# Patient Record
Sex: Female | Born: 1975 | Race: Black or African American | Hispanic: No | Marital: Single | Smoking: Never smoker
Health system: Southern US, Community
[De-identification: ages and names within clinical notes are randomized; demographics above are authoritative.]

## PROBLEM LIST (undated history)

## (undated) HISTORY — PX: FOOT SURGERY: SHX648

---

## 2002-09-14 ENCOUNTER — Emergency Department (HOSPITAL_COMMUNITY): Admission: EM | Admit: 2002-09-14 | Discharge: 2002-09-15 | Payer: Self-pay | Admitting: Emergency Medicine

## 2003-01-18 ENCOUNTER — Emergency Department (HOSPITAL_COMMUNITY): Admission: EM | Admit: 2003-01-18 | Discharge: 2003-01-18 | Payer: Self-pay | Admitting: Emergency Medicine

## 2006-08-16 ENCOUNTER — Emergency Department (HOSPITAL_COMMUNITY): Admission: EM | Admit: 2006-08-16 | Discharge: 2006-08-16 | Payer: Self-pay | Admitting: Family Medicine

## 2006-09-29 ENCOUNTER — Encounter: Admission: RE | Admit: 2006-09-29 | Discharge: 2006-12-28 | Payer: Self-pay | Admitting: Orthopedic Surgery

## 2007-05-10 ENCOUNTER — Emergency Department (HOSPITAL_COMMUNITY): Admission: EM | Admit: 2007-05-10 | Discharge: 2007-05-10 | Payer: Self-pay | Admitting: Emergency Medicine

## 2008-04-18 ENCOUNTER — Emergency Department (HOSPITAL_COMMUNITY): Admission: EM | Admit: 2008-04-18 | Discharge: 2008-04-18 | Payer: Self-pay | Admitting: Emergency Medicine

## 2008-09-29 IMAGING — CR DG ELBOW COMPLETE 3+V*R*
4 series · 4 of 4 positions shown · non-contrast
Comparison: none

CLINICAL DATA: Fell today with pain. 
 RIGHT ELBOW ? 4 VIEW:

[view not recorded (1 of 4)]
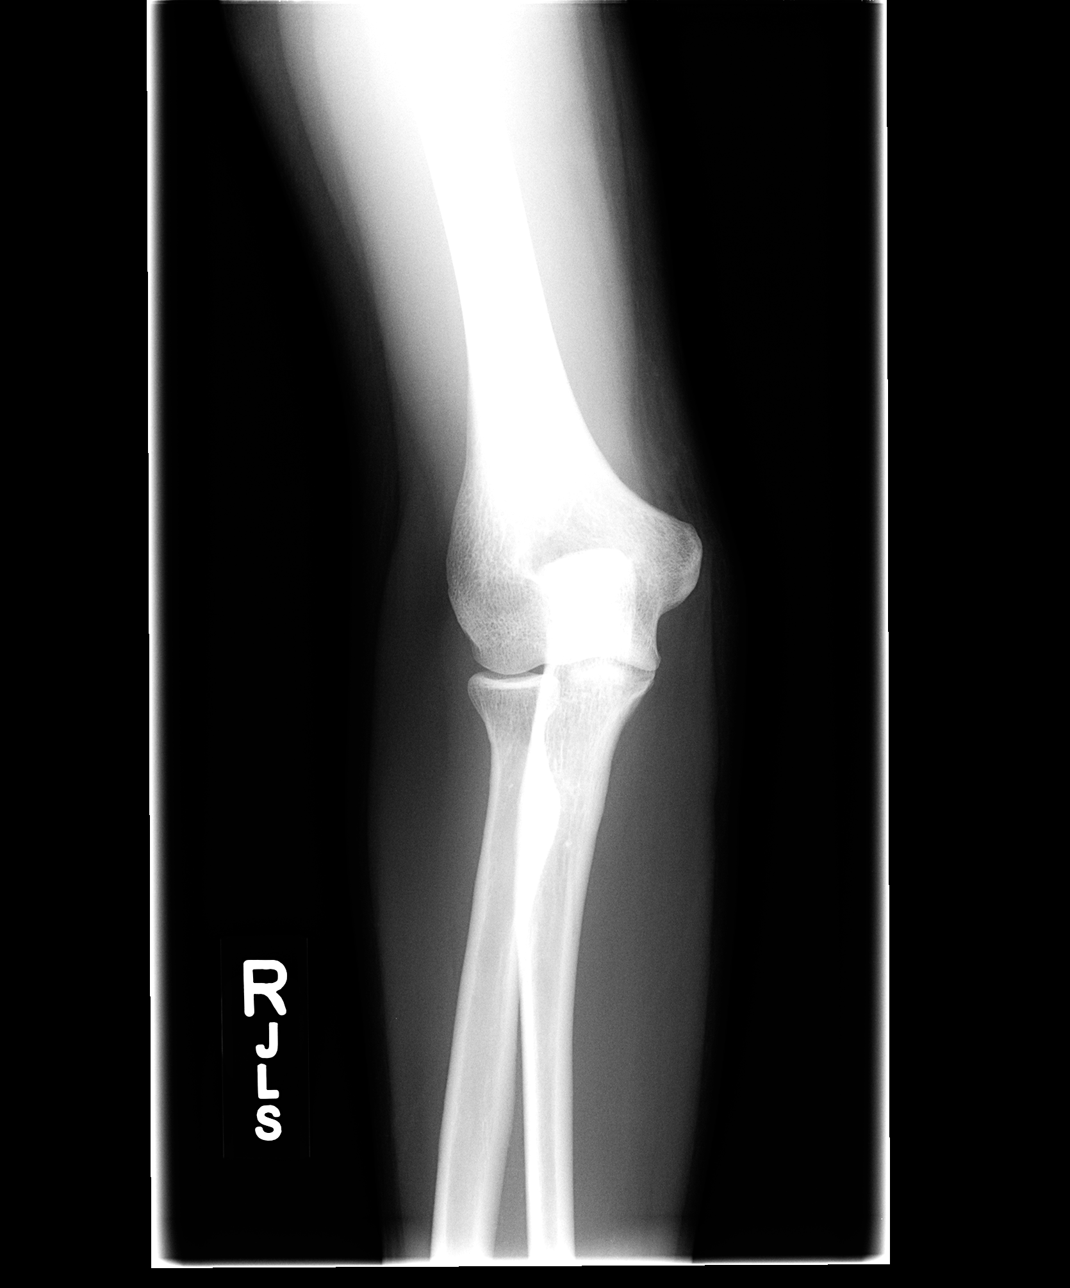

[view not recorded (2 of 4)]
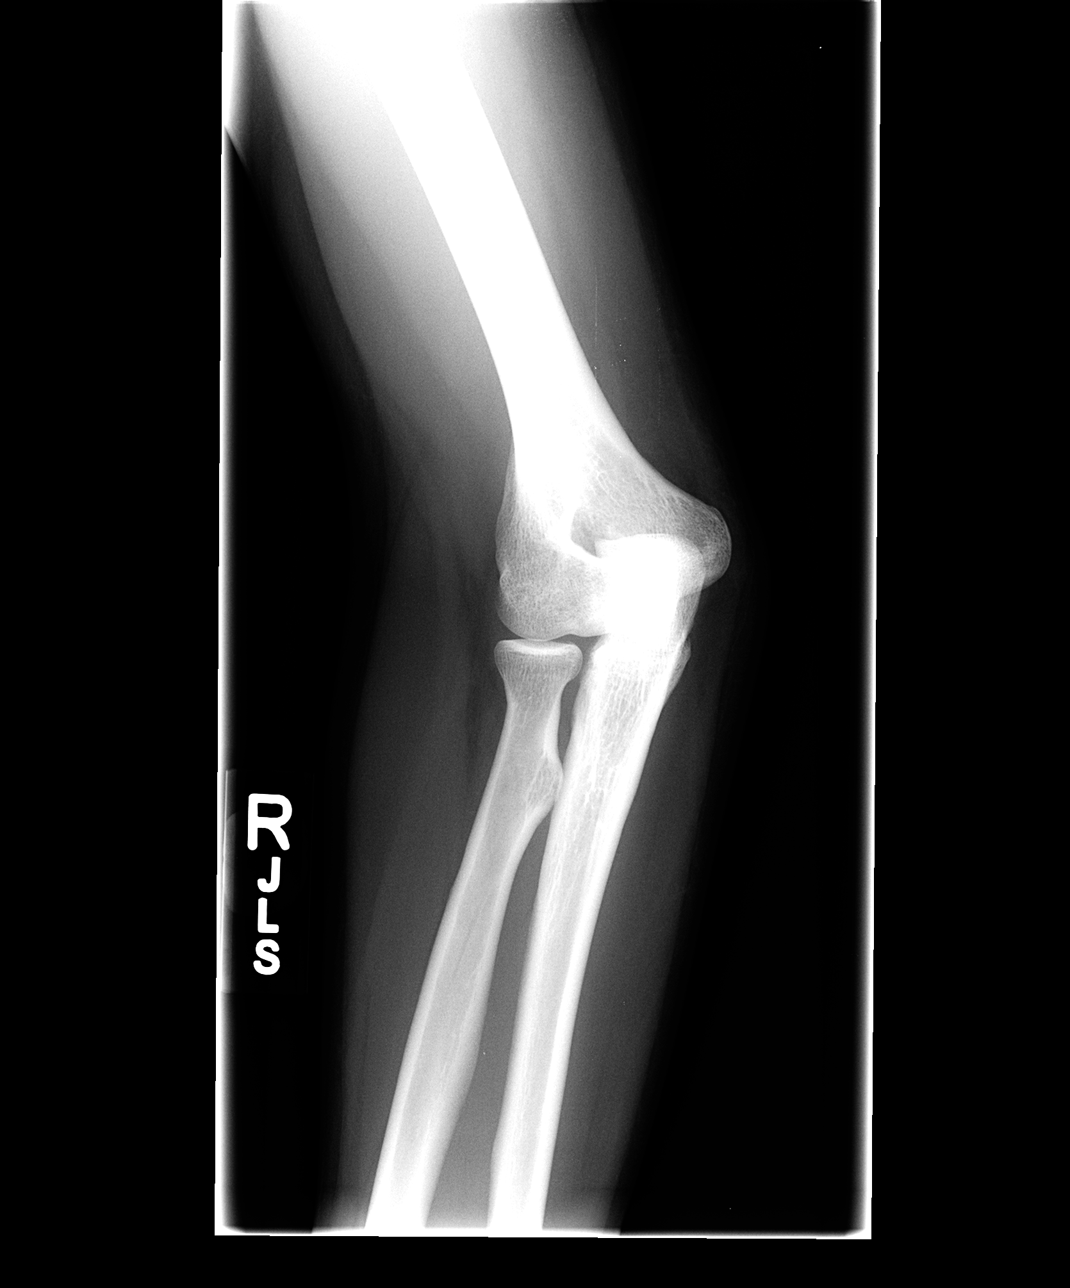

[view not recorded (3 of 4)]
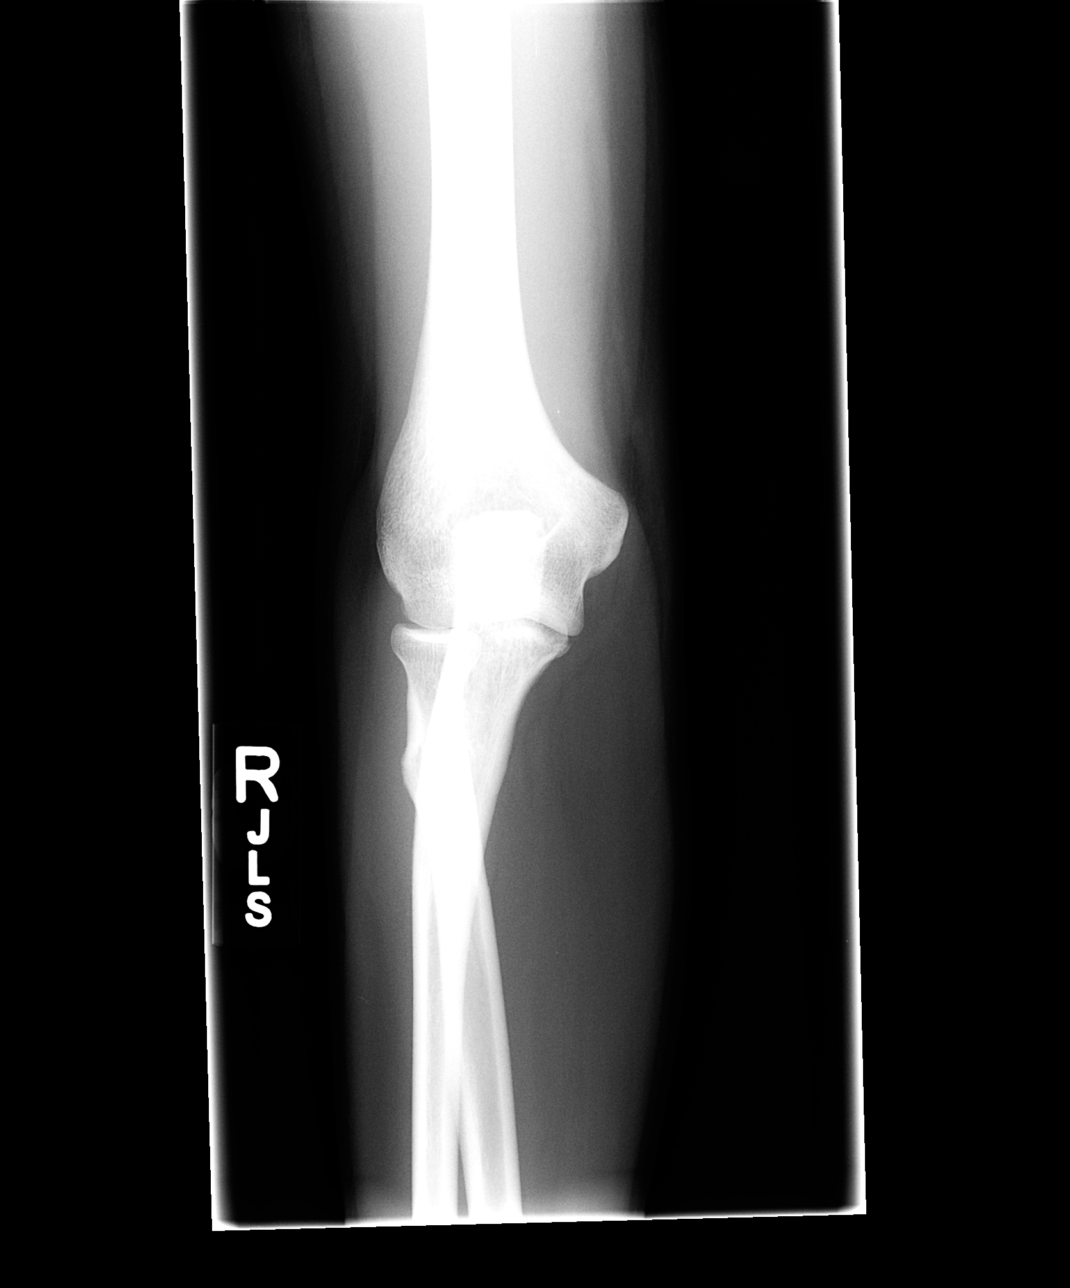

[view not recorded (4 of 4)]
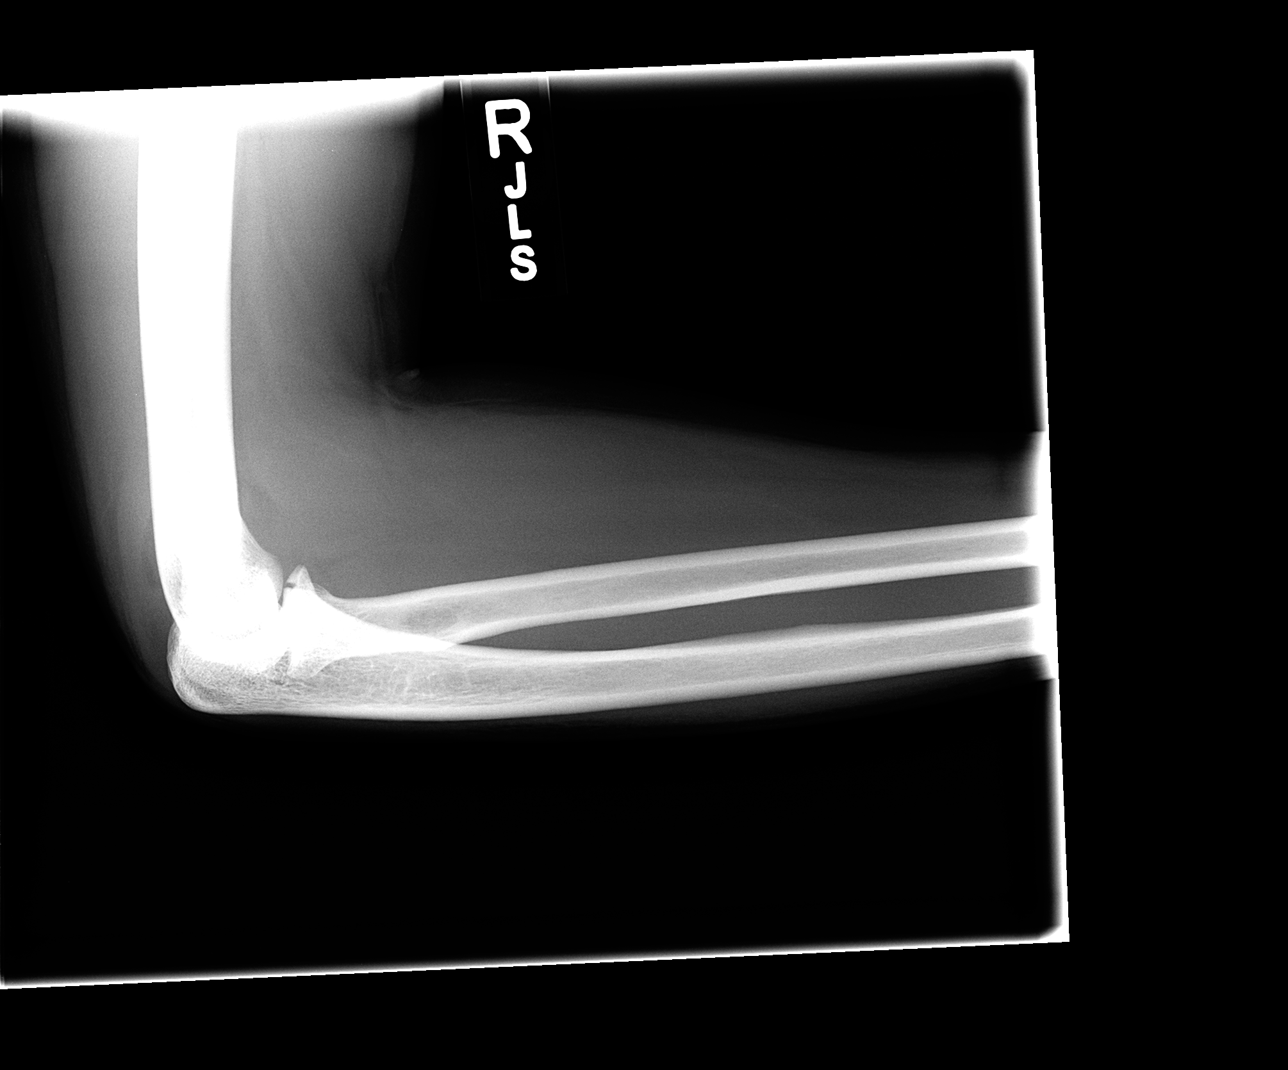

[4 of 4 positions shown; findings below may reference images not displayed]

FINDINGS: On the lateral view, there is a right elbow joint space effusion with displacement of anterior and posterior fat pads.  Also on the lateral view, are fractures noted through the coronoid process of the proximal right ulna.   The radial head appears intact.
IMPRESSION: Nondisplaced fracture of the coronoid process of the proximal right ulna with joint effusion.

## 2010-04-08 ENCOUNTER — Encounter: Admission: RE | Admit: 2010-04-08 | Discharge: 2010-04-08 | Payer: Self-pay | Admitting: Family Medicine

## 2010-04-09 ENCOUNTER — Emergency Department (HOSPITAL_COMMUNITY): Admission: EM | Admit: 2010-04-09 | Discharge: 2010-04-10 | Payer: Self-pay | Admitting: Emergency Medicine

## 2010-11-13 LAB — RAPID URINE DRUG SCREEN, HOSP PERFORMED
Amphetamines: NOT DETECTED
Benzodiazepines: NOT DETECTED
Cocaine: NOT DETECTED
Opiates: NOT DETECTED
Tetrahydrocannabinol: NOT DETECTED

## 2010-11-13 LAB — POCT I-STAT, CHEM 8
BUN: 16 mg/dL (ref 6–23)
Chloride: 107 mEq/L (ref 96–112)
Creatinine, Ser: 0.8 mg/dL (ref 0.4–1.2)
Potassium: 3.7 mEq/L (ref 3.5–5.1)
Sodium: 141 mEq/L (ref 135–145)

## 2010-11-13 LAB — URINALYSIS, ROUTINE W REFLEX MICROSCOPIC
Glucose, UA: NEGATIVE mg/dL
Ketones, ur: NEGATIVE mg/dL
Protein, ur: NEGATIVE mg/dL
Specific Gravity, Urine: 1.027 (ref 1.005–1.030)
pH: 6 (ref 5.0–8.0)

## 2010-11-13 LAB — DIFFERENTIAL
Basophils Absolute: 0.1 10*3/uL (ref 0.0–0.1)
Lymphocytes Relative: 39 % (ref 12–46)
Monocytes Relative: 7 % (ref 3–12)
Neutro Abs: 2.7 10*3/uL (ref 1.7–7.7)
Neutrophils Relative %: 48 % (ref 43–77)

## 2010-11-13 LAB — CBC
Hemoglobin: 11.4 g/dL — ABNORMAL LOW (ref 12.0–15.0)
MCH: 26.3 pg (ref 26.0–34.0)
MCHC: 32.6 g/dL (ref 30.0–36.0)
Platelets: 175 10*3/uL (ref 150–400)
RBC: 4.32 MIL/uL (ref 3.87–5.11)

## 2010-11-13 LAB — URINE MICROSCOPIC-ADD ON

## 2010-11-13 LAB — POCT PREGNANCY, URINE: Preg Test, Ur: NEGATIVE

## 2011-06-11 LAB — COMPREHENSIVE METABOLIC PANEL
Albumin: 3.7
BUN: 10
CO2: 26
Calcium: 8.8
Creatinine, Ser: 0.81
GFR calc Af Amer: >60

## 2011-06-11 LAB — DIFFERENTIAL
Basophils Relative: 0
Eosinophils Relative: 2
Monocytes Absolute: 0.3
Monocytes Relative: 5
Neutro Abs: 5.2
Neutrophils Relative %: 76

## 2011-06-11 LAB — URINALYSIS, ROUTINE W REFLEX MICROSCOPIC
Glucose, UA: NEGATIVE
Ketones, ur: NEGATIVE
pH: 6.5

## 2011-06-11 LAB — CBC
HCT: 37.3
Hemoglobin: 12.3
MCV: 79
WBC: 6.7

## 2011-06-11 LAB — URINE MICROSCOPIC-ADD ON

## 2011-06-11 LAB — PREGNANCY, URINE: Preg Test, Ur: NEGATIVE

## 2011-11-27 ENCOUNTER — Ambulatory Visit: Payer: Self-pay | Admitting: Internal Medicine

## 2011-11-27 VITALS — BP 117/78 | HR 61 | Temp 98.1°F | Resp 16 | Ht 68.0 in | Wt 167.0 lb

## 2011-11-27 DIAGNOSIS — D509 Iron deficiency anemia, unspecified: Secondary | ICD-10-CM

## 2011-11-27 DIAGNOSIS — D531 Other megaloblastic anemias, not elsewhere classified: Secondary | ICD-10-CM

## 2011-11-27 DIAGNOSIS — K921 Melena: Secondary | ICD-10-CM

## 2011-11-27 DIAGNOSIS — K589 Irritable bowel syndrome without diarrhea: Secondary | ICD-10-CM

## 2011-11-27 LAB — POCT CBC
Lymph, poc: 2.7 (ref 0.6–3.4)
MCV: 79.5 fL — AB (ref 80–97)
MPV: 11.2 fL (ref 0–99.8)
POC LYMPH PERCENT: 38.6 %L (ref 10–50)
POC MID %: 6.1 %M (ref 0–12)
Platelet Count, POC: 230 10*3/uL (ref 142–424)

## 2011-11-27 LAB — POCT SEDIMENTATION RATE: POCT SED RATE: 7 mm/hr (ref 0–22)

## 2011-11-27 MED ORDER — POLYETHYLENE GLYCOL 3350 17 G PO PACK: 17.0000 g | PACK | Freq: Every day | ORAL | Status: AC

## 2011-11-27 MED ORDER — DICYCLOMINE HCL 20 MG PO TABS: 20.0000 mg | ORAL_TABLET | Freq: Four times a day (QID) | ORAL | Status: DC

## 2011-11-27 MED ORDER — HYDROCORTISONE ACETATE 25 MG RE SUPP: 25.0000 mg | Freq: Two times a day (BID) | RECTAL | Status: AC

## 2011-11-29 NOTE — Progress Notes (Signed)
  Subjective:    Patient ID: Sandra Mejia, female    DOB: [birth year], 36 y.o.   MRN: [id number]  HPIPresents with a Three-week history of Infrequent stools stools and a two-day history of blood in the bowel movements. She has a long past history of irritable bowel syndrome though has almost never been treated. She is now cramping in her left lower quadrant off and on during the day There is no fever/nausea/vomiting/genitourinary problems    Review of Systems Noncontributory    Objective:   Physical ExamVital signs are stable The abdomen is soft nontender nondistended except for mild tenderness in the left lower quadrant but no rebound The perineal area is clear without signs of external hemorrhoids or anal fissure Digital exam reveals constipation/the glove is slightly bloody after exam    Results for orders placed in visit on 11/27/11  POCT CBC      Component Value Range   WBC 7.1  4.6 - 10.2 (K/uL)   Lymph, poc 2.7  0.6 - 3.4    POC LYMPH PERCENT 38.6  10 - 50 (%L)   MID (cbc) 0.4  0 - 0.9    POC MID % 6.1  0 - 12 (%M)   POC Granulocyte 3.9  2 - 6.9    Granulocyte percent 55.3  37 - 80 (%G)   RBC 4.59  4.04 - 5.48 (M/uL)   Hemoglobin 11.7 (*) 12.2 - 16.2 (g/dL)   HCT, POC 30.8 (*) 65.7 - 47.9 (%)   MCV 79.5 (*) 80 - 97 (fL)   MCH, POC 25.5 (*) 27 - 31.2 (pg)   MCHC 32.1  31.8 - 35.4 (g/dL)   RDW, POC 84.6     Platelet Count, POC 230  142 - 424 (K/uL)   MPV 11.2  0 - 99.8 (fL)  POCT SEDIMENTATION RATE      Component Value Range   POCT SED RATE 7  0 - 22 (mm/hr)       Assessment & Plan:  Problem #1 hematochezia Problem #2 constipation with irritable bowel syndrome  Plan MiraLax until regular Anusol-HC 25 mg suppository twice a day for 2 weeks Bentyl with meals and at bedtime for 2 weeks Recheck in 2-4 weeks Internal hemorrhoids are suspected with this IBS and sigmoidoscopy would be the next step if needed  Problem #3 iron deficiency anemia-this has been a  persistent problem for a long time and she is currently not taking her iron supplements/she will begin Fe and recheck in 3 months

## 2011-12-09 ENCOUNTER — Telehealth: Payer: Self-pay

## 2011-12-09 NOTE — Telephone Encounter (Signed)
PT STATES SHE HAS STOPPED TAKING IBS MEDS BECAUSE HER HAIR IS COMING OUT   PLEASE ADVISE   [facility] [address]

## 2011-12-10 NOTE — Telephone Encounter (Signed)
Please tell patient it is unlikely that the Bentyl is causing her to lose her hair, it is more likely due to her anemia and the fact that she is not absorbing enough nutrients due to her irritable bowel symptoms.  She may stop her medications but needs to return to see Dr. Merla Riches if her symptoms recur or be referred to GI.  thanks

## 2011-12-12 NOTE — Telephone Encounter (Signed)
LMOM TO CB 

## 2011-12-15 NOTE — Telephone Encounter (Signed)
Spoke with patient and explained to her about the Bentyl and that if she continues to have problems rtc to see Dr. Merla Riches.  Patient stated that she understood and would rtc this week.

## 2012-03-24 ENCOUNTER — Telehealth: Payer: Self-pay

## 2012-03-24 NOTE — Telephone Encounter (Signed)
Pt called third time - same thing.  Please call - stated we need 24 hours to return calls to patients

## 2012-03-24 NOTE — Telephone Encounter (Addendum)
PT STATES SHE WAS SEEN ON A W/C WITH DR DAUB AND THE FORMS WAS MISSING SOMETHINGS. SHE LIVE IN [place] AND CAN'T COME BACK IN FOR THEM TO BE CORRECTED WOULD LIKE TO SPEAK WITH SOMEONE ABOUT IT. PLEASE CALL [phone]

## 2012-03-25 NOTE — Telephone Encounter (Signed)
Pt called and note faxed to job

## 2012-03-27 ENCOUNTER — Telehealth: Payer: Self-pay

## 2012-03-27 NOTE — Telephone Encounter (Signed)
This is WC, chart at PA desk, pt states she wants note to be changed, we should probably ask Dr Cleta Alberts, he is not here today.

## 2012-03-27 NOTE — Telephone Encounter (Signed)
Chart is with WC, I have called them and it is on the way back over here, will review it and call patient

## 2012-03-27 NOTE — Telephone Encounter (Signed)
PT STATES HER NOTE DR DAUB HAD WRITTEN WAS WRONG AGAIN. SHE IS TO HAVE GONE BACK TO WORK 7/26 INSTEAD OF 7/25 PLEASE CALL [phone] AND SHE WILL HAVE THE FAX NUMBER WHERE TO FAX IT. THIS IS A W/C

## 2012-05-19 ENCOUNTER — Telehealth: Payer: Self-pay

## 2012-05-19 NOTE — Telephone Encounter (Signed)
I have this, on my desk will take care of it/ is WC should not be in Epic

## 2012-05-19 NOTE — Telephone Encounter (Signed)
PT WAS SEEN ON W/C ON 03/29/12 AND IT IS A NOTE IN SYSTEM THAT [employer] PUT FOR DR DAUB. PT CALLED TO SAY DR DAUB NEED TO FAX OVER THE FORM TO [phone] ATTN: [place] AND YOU MAY REACH PT AT [phone] I TOLD PT TO LET [employer] GIVE HER A CALL ON Monday SINCE SHE IS FAMILIAR WITH IT, BUT SHE WOULD PREFER DR DAUB SEND SOMETHING OVER

## 2012-05-22 IMAGING — CT CT HEAD W/O CM
2 series · 16 of 30 positions shown, 20 images · non-contrast
Comparison: None.

CLINICAL DATA: Headaches, facial numbness, dizziness

CT HEAD WITHOUT CONTRAST
TECHNIQUE: Contiguous axial images were obtained from the base of
the skull through the vertex without contrast.

[Series 2: head w/o · axial · non-contrast · 0.49mm/px · z∈[+14,+139]mm · 13 of 28 slices shown, 17 images]
[im 2/28  brain]
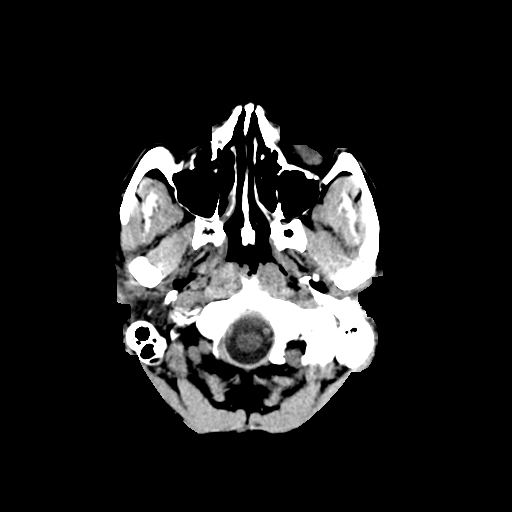
[im 2/28  bone]
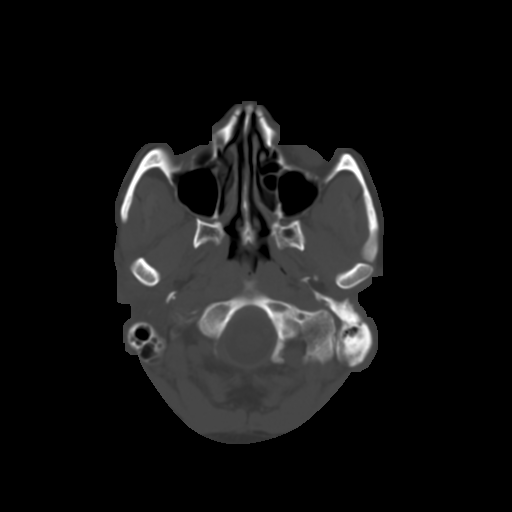
[im 4/28  brain]
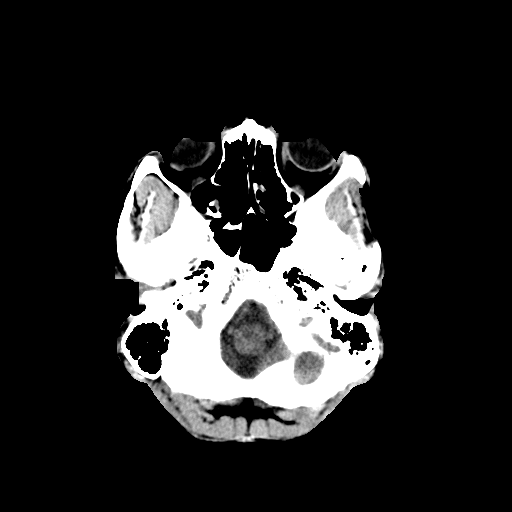
[im 6/28  brain]
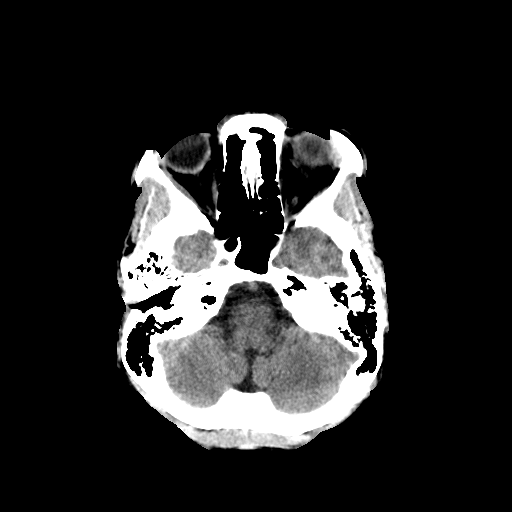
[im 8/28  brain]
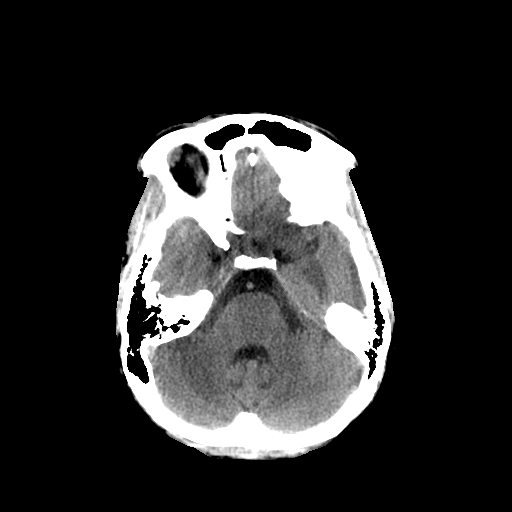
[im 10/28  brain]
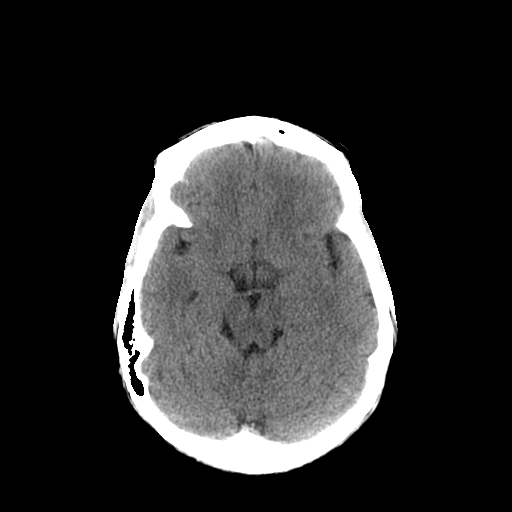
[im 10/28  bone]
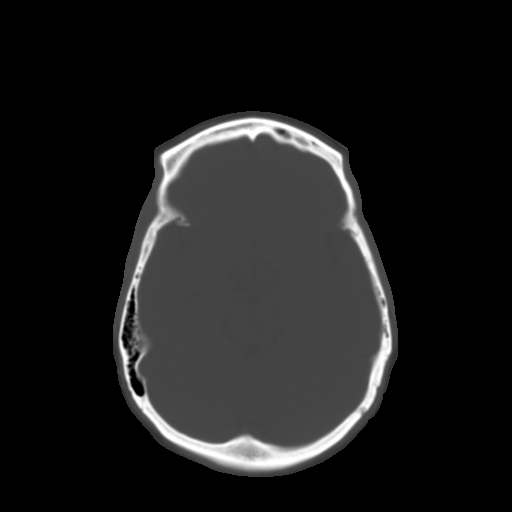
[im 12/28  brain]
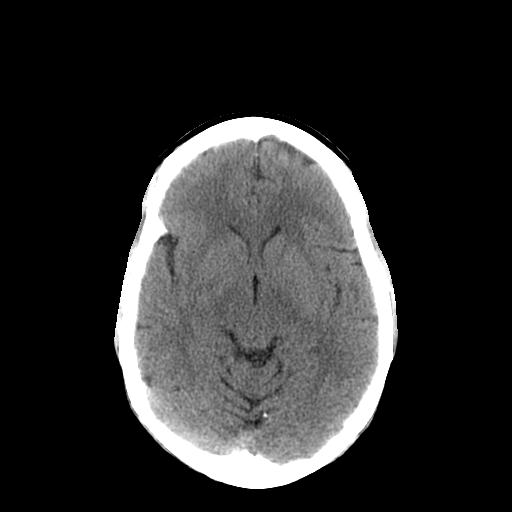
[im 14/28  brain]
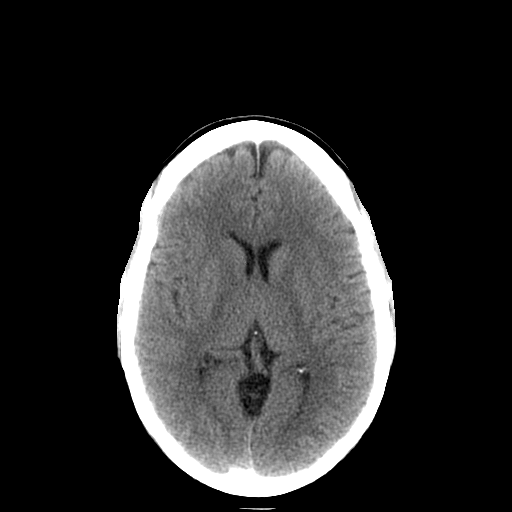
[im 16/28  brain]
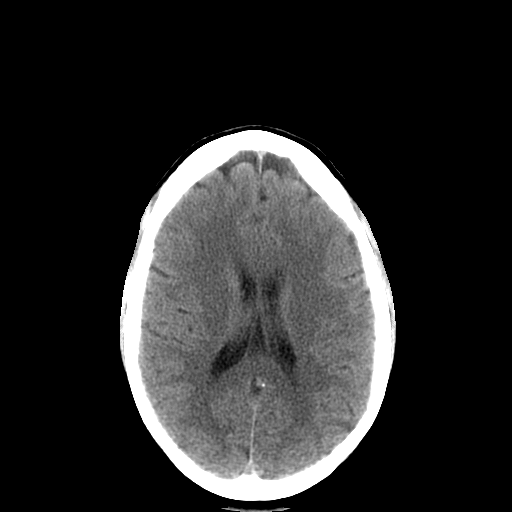
[im 18/28  brain]
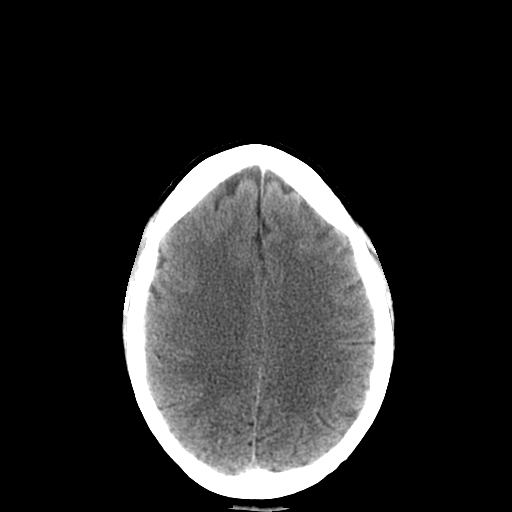
[im 18/28  bone]
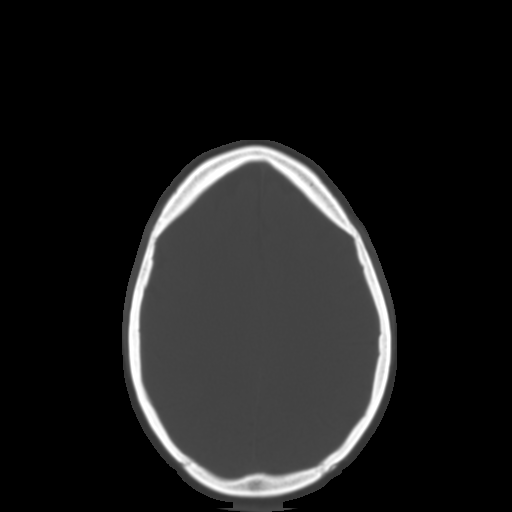
[im 20/28  brain]
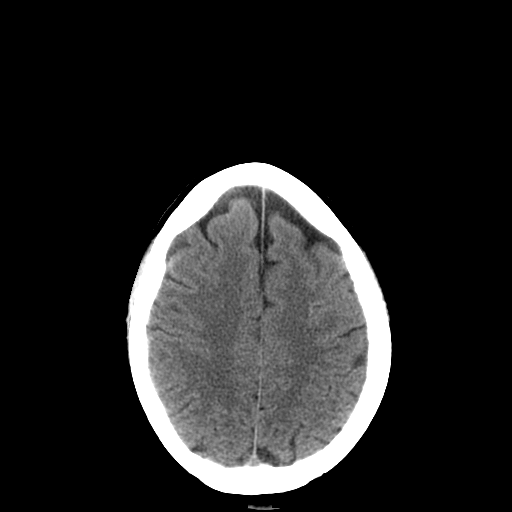
[im 22/28  brain]
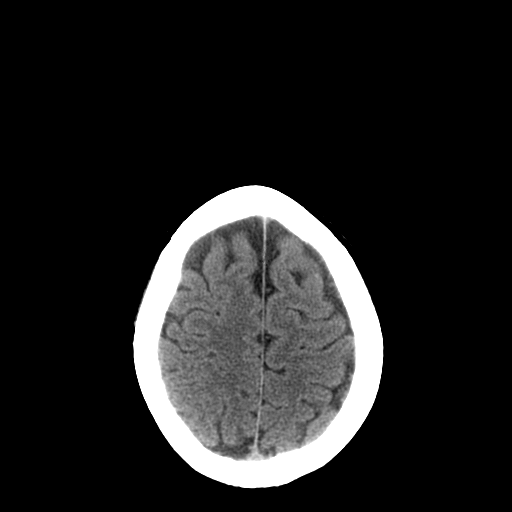
[im 24/28  brain]
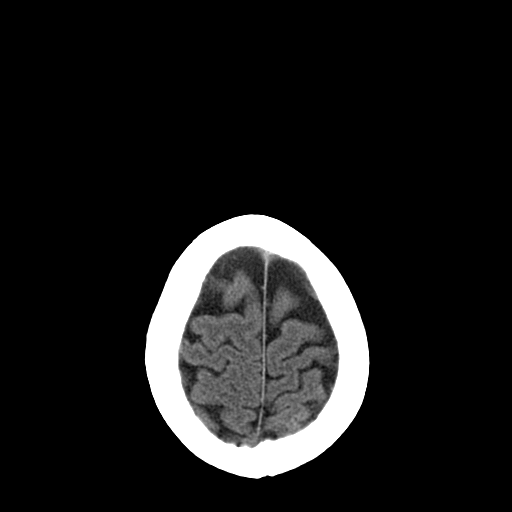
[im 26/28  brain]
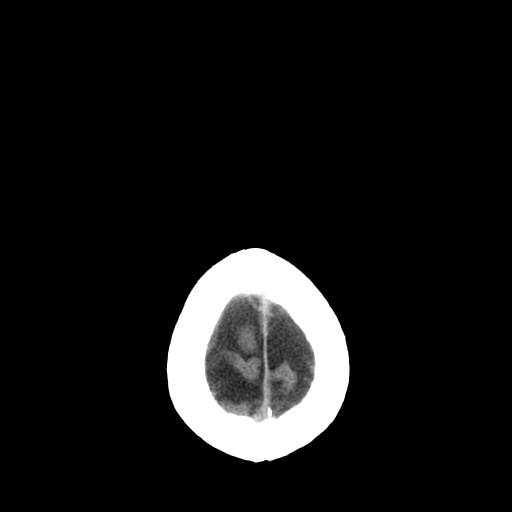
[im 26/28  bone]
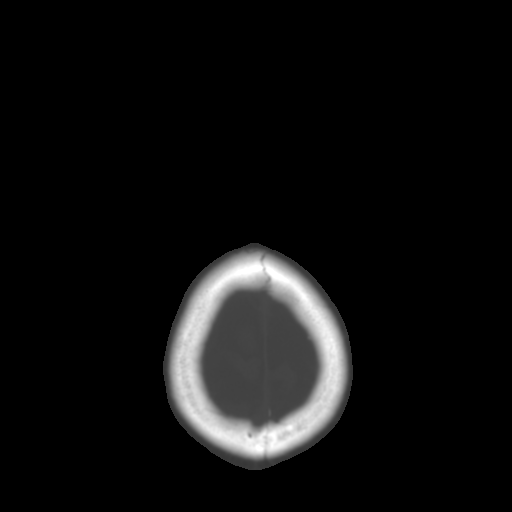

[Series 3: head bone · axial · 0.49mm/px · z∈[+14,+56]mm · 3 of 28 slices shown]
[im 2/28  bone]
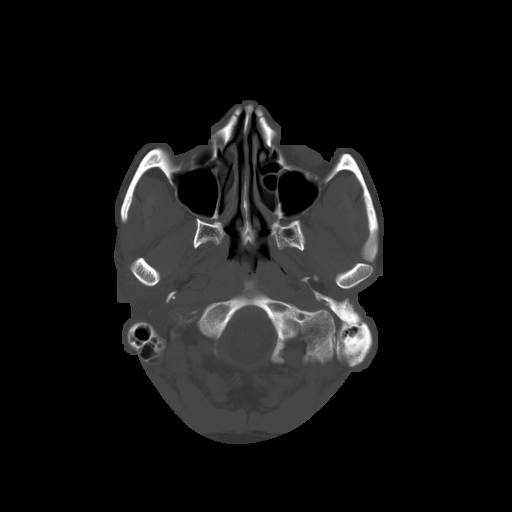
[im 6/28  bone]
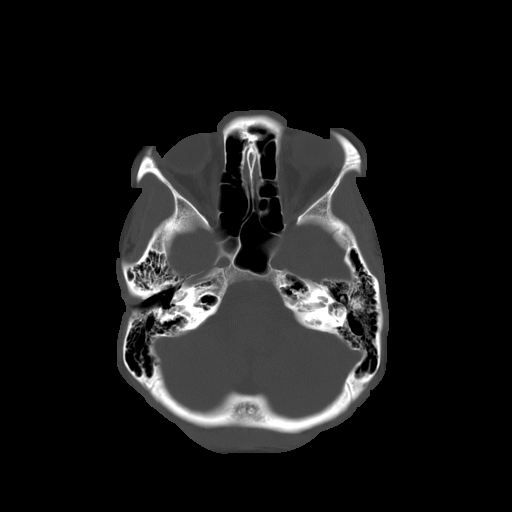
[im 10/28  bone]
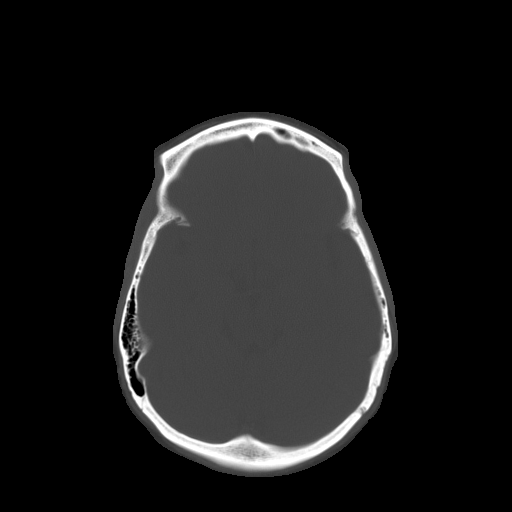

[16 of 30 positions shown; findings below may reference images not displayed]

FINDINGS: There is no evidence of acute intracranial hemorrhage,
brain edema, mass lesion, acute infarction,   mass effect, or
midline shift. Acute infarct may be inapparent on noncontrast CT.
No other intra-axial abnormalities are seen, and the ventricles and
sulci are within normal limits in size and symmetry.   No abnormal
extra-axial fluid collections or masses are identified.  No
significant calvarial abnormality.
IMPRESSION: Negative for bleed or other acute intracranial process.

## 2013-11-23 ENCOUNTER — Other Ambulatory Visit: Payer: Self-pay | Admitting: Family Medicine

## 2013-11-23 DIAGNOSIS — N852 Hypertrophy of uterus: Secondary | ICD-10-CM

## 2013-11-27 ENCOUNTER — Ambulatory Visit
Admission: RE | Admit: 2013-11-27 | Discharge: 2013-11-27 | Disposition: A | Discharge status: Home / Self Care | Payer: 59 | Source: Ambulatory Visit | Attending: Family Medicine | Admitting: Family Medicine

## 2013-11-27 DIAGNOSIS — N852 Hypertrophy of uterus: Secondary | ICD-10-CM

## 2014-12-04 ENCOUNTER — Ambulatory Visit (INDEPENDENT_AMBULATORY_CARE_PROVIDER_SITE_OTHER): Payer: 59 | Admitting: Family Medicine

## 2014-12-04 VITALS — BP 116/68 | HR 87 | Temp 98.1°F | Resp 18 | Ht 68.5 in | Wt 169.0 lb

## 2014-12-04 DIAGNOSIS — R5381 Other malaise: Secondary | ICD-10-CM

## 2014-12-04 DIAGNOSIS — R5383 Other fatigue: Secondary | ICD-10-CM | POA: Diagnosis not present

## 2014-12-04 DIAGNOSIS — R6883 Chills (without fever): Secondary | ICD-10-CM | POA: Diagnosis not present

## 2014-12-04 DIAGNOSIS — J101 Influenza due to other identified influenza virus with other respiratory manifestations: Secondary | ICD-10-CM

## 2014-12-04 DIAGNOSIS — R05 Cough: Secondary | ICD-10-CM

## 2014-12-04 DIAGNOSIS — R059 Cough, unspecified: Secondary | ICD-10-CM

## 2014-12-04 LAB — POCT INFLUENZA A/B
Influenza A, POC: POSITIVE
Influenza B, POC: NEGATIVE

## 2014-12-04 MED ORDER — HYDROCODONE-HOMATROPINE 5-1.5 MG/5ML PO SYRP: 5.0000 mL | ORAL_SOLUTION | Freq: Three times a day (TID) | ORAL | Status: DC | PRN

## 2014-12-04 NOTE — Progress Notes (Signed)
[facility] [address], [place] [id number] [phone]  Date:  12/04/2014   Name:  Sandra JarredSelena Koestner   DOB:  12/27/1975   MRN:  [id number]  PCP:  No PCP Per Patient    Chief Complaint: Chills; Fever; and Generalized Body Aches   History of Present Illness:  Sandra JarredSelena Schoon is a 39 y.o. very pleasant female patient who presents with the following:  Here today as a new patient with illness for one week. She has noted chills, body aches.  Her "stomach is sore, I guess from coughing so much."  She has been coughing, bringing up just a little mucus.  She has noted some post- tussive emesis.   She also notes a ST, mild runny nose when she eats hot foods.   She has tried dayquil and nyquil, and some OTC cough medication  No meds today so far She is generally in good health  There are no active problems to display for this patient.   History reviewed. No pertinent past medical history.  Past Surgical History  Procedure Laterality Date  . Foot surgery      History  Substance Use Topics  . Smoking status: Never Smoker   . Smokeless tobacco: Not on file  . Alcohol Use: Not on file    No family history on file.  No Known Allergies  Medication list has been reviewed and updated.  Current Outpatient Prescriptions on File Prior to Visit  Medication Sig Dispense Refill  . dicyclomine (BENTYL) 20 MG tablet Take 1 tablet (20 mg total) by mouth every 6 (six) hours. Before eating a meal and at bedtime (Patient not taking: Reported on 12/04/2014) 60 tablet 2   No current facility-administered medications on file prior to visit.    Review of Systems:  As per HPI- otherwise negative.   Physical Examination: Filed Vitals:   12/04/14 1223  BP: 116/68  Pulse: 87  Temp: 98.1 F (36.7 C)  Resp: 18   Filed Vitals:   12/04/14 1223  Height: 5' 8.5" (1.74 m)  Weight: 169 lb (76.658 kg)   Body mass index is 25.32 kg/(m^2). Ideal Body Weight: Weight in (lb) to  have BMI = 25: 166.5  GEN: WDWN, NAD, Non-toxic, A & O x 3, looks well HEENT: Atraumatic, Normocephalic. Neck supple. No masses, No LAD.  Bilateral TM wnl, oropharynx normal.  PEERL,EOMI.   Ears and Nose: No external deformity. CV: RRR, No M/G/R. No JVD. No thrill. No extra heart sounds. PULM: CTA B, no wheezes, crackles, rhonchi. No retractions. No resp. distress. No accessory muscle use. ABD: S, NT, ND. No rebound. No HSM. EXTR: No c/c/e NEURO Normal gait.  PSYCH: Normally interactive. Conversant. Not depressed or anxious appearing.  Calm demeanor.   Results for orders placed or performed in visit on 12/04/14  POCT Influenza A/B  Result Value Ref Range   Influenza A, POC Positive    Influenza B, POC Negative     Assessment and Plan: Influenza A - Plan: HYDROcodone-homatropine (HYCODAN) 5-1.5 MG/5ML syrup  Malaise - Plan: POCT Influenza A/B  Chills - Plan: POCT Influenza A/B  Cough - Plan: POCT Influenza A/B  Other fatigue - Plan: POCT Influenza A/B influenza.  At this time it is too late for tamiflu.  Hycodan as needed- See patient instructions for more details.      Signed Abbe AmsterdamJessica Copland, MD

## 2014-12-04 NOTE — Patient Instructions (Addendum)
You do have the flu. Rest and drink plenty of fluids, but at this point you should be nearly to the end of your illness.   Let me know if you are not feeling better over the next couple of days Use the cough syrup as needed but remember this can make you feel sleepy

## 2015-06-12 ENCOUNTER — Ambulatory Visit (INDEPENDENT_AMBULATORY_CARE_PROVIDER_SITE_OTHER): Admitting: Emergency Medicine

## 2015-06-12 ENCOUNTER — Ambulatory Visit

## 2015-06-12 VITALS — BP 124/75 | HR 61 | Temp 98.3°F | Resp 16 | Ht 67.75 in | Wt 170.1 lb

## 2015-06-12 DIAGNOSIS — S161XXA Strain of muscle, fascia and tendon at neck level, initial encounter: Secondary | ICD-10-CM

## 2015-06-12 MED ORDER — HYDROCODONE-ACETAMINOPHEN 5-325 MG PO TABS: 1.0000 | ORAL_TABLET | ORAL | Status: DC | PRN

## 2015-06-12 MED ORDER — CYCLOBENZAPRINE HCL 5 MG PO TABS: 5.0000 mg | ORAL_TABLET | Freq: Three times a day (TID) | ORAL | Status: DC | PRN

## 2015-06-12 MED ORDER — NAPROXEN SODIUM 550 MG PO TABS: 550.0000 mg | ORAL_TABLET | Freq: Two times a day (BID) | ORAL | Status: DC

## 2015-06-12 NOTE — Patient Instructions (Signed)
Cervical Sprain  A cervical sprain is an injury in the neck in which the strong, fibrous tissues (ligaments) that connect your neck bones stretch or tear. Cervical sprains can range from mild to severe. Severe cervical sprains can cause the neck vertebrae to be unstable. This can lead to damage of the spinal cord and can result in serious nervous system problems. The amount of time it takes for a cervical sprain to get better depends on the cause and extent of the injury. Most cervical sprains heal in 1 to 3 weeks.  CAUSES   Severe cervical sprains may be caused by:    Contact sport injuries (such as from football, rugby, wrestling, hockey, auto racing, gymnastics, diving, martial arts, or boxing).    Motor vehicle collisions.    Whiplash injuries. This is an injury from a sudden forward and backward whipping movement of the head and neck.   Falls.   Mild cervical sprains may be caused by:    Being in an awkward position, such as while cradling a telephone between your ear and shoulder.    Sitting in a chair that does not offer proper support.    Working at a poorly designed computer station.    Looking up or down for long periods of time.   SYMPTOMS    Pain, soreness, stiffness, or a burning sensation in the front, back, or sides of the neck. This discomfort may develop immediately after the injury or slowly, 24 hours or more after the injury.    Pain or tenderness directly in the middle of the back of the neck.    Shoulder or upper back pain.    Limited ability to move the neck.    Headache.    Dizziness.    Weakness, numbness, or tingling in the hands or arms.    Muscle spasms.    Difficulty swallowing or chewing.    Tenderness and swelling of the neck.   DIAGNOSIS   Most of the time your health care provider can diagnose a cervical sprain by taking your history and doing a physical exam. Your health care provider will ask about previous neck injuries and any known neck  problems, such as arthritis in the neck. X-rays may be taken to find out if there are any other problems, such as with the bones of the neck. Other tests, such as a CT scan or MRI, may also be needed.   TREATMENT   Treatment depends on the severity of the cervical sprain. Mild sprains can be treated with rest, keeping the neck in place (immobilization), and pain medicines. Severe cervical sprains are immediately immobilized. Further treatment is done to help with pain, muscle spasms, and other symptoms and may include:   Medicines, such as pain relievers, numbing medicines, or muscle relaxants.    Physical therapy. This may involve stretching exercises, strengthening exercises, and posture training. Exercises and improved posture can help stabilize the neck, strengthen muscles, and help stop symptoms from returning.   HOME CARE INSTRUCTIONS    Put ice on the injured area.     Put ice in a plastic bag.     Place a towel between your skin and the bag.     Leave the ice on for 15-20 minutes, 3-4 times a day.    If your injury was severe, you may have been given a cervical collar to wear. A cervical collar is a two-piece collar designed to keep your neck from moving while it heals.      Do not remove the collar unless instructed by your health care provider.    If you have long hair, keep it outside of the collar.    Ask your health care provider before making any adjustments to your collar. Minor adjustments may be required over time to improve comfort and reduce pressure on your chin or on the back of your head.    Ifyou are allowed to remove the collar for cleaning or bathing, follow your health care provider's instructions on how to do so safely.    Keep your collar clean by wiping it with mild soap and water and drying it completely. If the collar you have been given includes removable pads, remove them every 1-2 days and hand wash them with soap and water. Allow them to air dry. They should be completely  dry before you wear them in the collar.    If you are allowed to remove the collar for cleaning and bathing, wash and dry the skin of your neck. Check your skin for irritation or sores. If you see any, tell your health care provider.    Do not drive while wearing the collar.    Only take over-the-counter or prescription medicines for pain, discomfort, or fever as directed by your health care provider.    Keep all follow-up appointments as directed by your health care provider.    Keep all physical therapy appointments as directed by your health care provider.    Make any needed adjustments to your workstation to promote good posture.    Avoid positions and activities that make your symptoms worse.    Warm up and stretch before being active to help prevent problems.   SEEK MEDICAL CARE IF:    Your pain is not controlled with medicine.    You are unable to decrease your pain medicine over time as planned.    Your activity level is not improving as expected.   SEEK IMMEDIATE MEDICAL CARE IF:    You develop any bleeding.   You develop stomach upset.   You have signs of an allergic reaction to your medicine.    Your symptoms get worse.    You develop new, unexplained symptoms.    You have numbness, tingling, weakness, or paralysis in any part of your body.   MAKE SURE YOU:    Understand these instructions.   Will watch your condition.   Will get help right away if you are not doing well or get worse.     This information is not intended to replace advice given to you by your health care provider. Make sure you discuss any questions you have with your health care provider.     Document Released: 06/13/2007 Document Revised: 08/21/2013 Document Reviewed: 02/21/2013  Elsevier Interactive Patient Education 2016 [employer]
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
## 2015-06-16 ENCOUNTER — Ambulatory Visit (INDEPENDENT_AMBULATORY_CARE_PROVIDER_SITE_OTHER): Admitting: Emergency Medicine

## 2015-06-16 VITALS — BP 120/74 | HR 64 | Temp 98.3°F | Resp 18 | Ht 69.25 in | Wt 175.6 lb

## 2015-06-16 DIAGNOSIS — S161XXA Strain of muscle, fascia and tendon at neck level, initial encounter: Secondary | ICD-10-CM

## 2015-06-16 MED ORDER — OXYCODONE-ACETAMINOPHEN 5-325 MG PO TABS: 1.0000 | ORAL_TABLET | Freq: Four times a day (QID) | ORAL | Status: DC | PRN
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
## 2015-06-16 NOTE — Patient Instructions (Signed)
Cervical Sprain  A cervical sprain is an injury in the neck in which the strong, fibrous tissues (ligaments) that connect your neck bones stretch or tear. Cervical sprains can range from mild to severe. Severe cervical sprains can cause the neck vertebrae to be unstable. This can lead to damage of the spinal cord and can result in serious nervous system problems. The amount of time it takes for a cervical sprain to get better depends on the cause and extent of the injury. Most cervical sprains heal in 1 to 3 weeks.  CAUSES   Severe cervical sprains may be caused by:    Contact sport injuries (such as from football, rugby, wrestling, hockey, auto racing, gymnastics, diving, martial arts, or boxing).    Motor vehicle collisions.    Whiplash injuries. This is an injury from a sudden forward and backward whipping movement of the head and neck.   Falls.   Mild cervical sprains may be caused by:    Being in an awkward position, such as while cradling a telephone between your ear and shoulder.    Sitting in a chair that does not offer proper support.    Working at a poorly designed computer station.    Looking up or down for long periods of time.   SYMPTOMS    Pain, soreness, stiffness, or a burning sensation in the front, back, or sides of the neck. This discomfort may develop immediately after the injury or slowly, 24 hours or more after the injury.    Pain or tenderness directly in the middle of the back of the neck.    Shoulder or upper back pain.    Limited ability to move the neck.    Headache.    Dizziness.    Weakness, numbness, or tingling in the hands or arms.    Muscle spasms.    Difficulty swallowing or chewing.    Tenderness and swelling of the neck.   DIAGNOSIS   Most of the time your health care provider can diagnose a cervical sprain by taking your history and doing a physical exam. Your health care provider will ask about previous neck injuries and any known neck  problems, such as arthritis in the neck. X-rays may be taken to find out if there are any other problems, such as with the bones of the neck. Other tests, such as a CT scan or MRI, may also be needed.   TREATMENT   Treatment depends on the severity of the cervical sprain. Mild sprains can be treated with rest, keeping the neck in place (immobilization), and pain medicines. Severe cervical sprains are immediately immobilized. Further treatment is done to help with pain, muscle spasms, and other symptoms and may include:   Medicines, such as pain relievers, numbing medicines, or muscle relaxants.    Physical therapy. This may involve stretching exercises, strengthening exercises, and posture training. Exercises and improved posture can help stabilize the neck, strengthen muscles, and help stop symptoms from returning.   HOME CARE INSTRUCTIONS    Put ice on the injured area.     Put ice in a plastic bag.     Place a towel between your skin and the bag.     Leave the ice on for 15-20 minutes, 3-4 times a day.    If your injury was severe, you may have been given a cervical collar to wear. A cervical collar is a two-piece collar designed to keep your neck from moving while it heals.      Do not remove the collar unless instructed by your health care provider.    If you have long hair, keep it outside of the collar.    Ask your health care provider before making any adjustments to your collar. Minor adjustments may be required over time to improve comfort and reduce pressure on your chin or on the back of your head.    Ifyou are allowed to remove the collar for cleaning or bathing, follow your health care provider's instructions on how to do so safely.    Keep your collar clean by wiping it with mild soap and water and drying it completely. If the collar you have been given includes removable pads, remove them every 1-2 days and hand wash them with soap and water. Allow them to air dry. They should be completely  dry before you wear them in the collar.    If you are allowed to remove the collar for cleaning and bathing, wash and dry the skin of your neck. Check your skin for irritation or sores. If you see any, tell your health care provider.    Do not drive while wearing the collar.    Only take over-the-counter or prescription medicines for pain, discomfort, or fever as directed by your health care provider.    Keep all follow-up appointments as directed by your health care provider.    Keep all physical therapy appointments as directed by your health care provider.    Make any needed adjustments to your workstation to promote good posture.    Avoid positions and activities that make your symptoms worse.    Warm up and stretch before being active to help prevent problems.   SEEK MEDICAL CARE IF:    Your pain is not controlled with medicine.    You are unable to decrease your pain medicine over time as planned.    Your activity level is not improving as expected.   SEEK IMMEDIATE MEDICAL CARE IF:    You develop any bleeding.   You develop stomach upset.   You have signs of an allergic reaction to your medicine.    Your symptoms get worse.    You develop new, unexplained symptoms.    You have numbness, tingling, weakness, or paralysis in any part of your body.   MAKE SURE YOU:    Understand these instructions.   Will watch your condition.   Will get help right away if you are not doing well or get worse.     This information is not intended to replace advice given to you by your health care provider. Make sure you discuss any questions you have with your health care provider.     Document Released: 06/13/2007 Document Revised: 08/21/2013 Document Reviewed: 02/21/2013  Elsevier Interactive Patient Education 2016 [employer]

## 2015-06-23 ENCOUNTER — Ambulatory Visit (INDEPENDENT_AMBULATORY_CARE_PROVIDER_SITE_OTHER): Admitting: Family Medicine

## 2015-06-23 VITALS — BP 110/74 | HR 61 | Temp 98.3°F | Resp 12 | Ht 67.0 in | Wt 168.6 lb

## 2015-06-23 DIAGNOSIS — S161XXD Strain of muscle, fascia and tendon at neck level, subsequent encounter: Secondary | ICD-10-CM

## 2015-06-23 DIAGNOSIS — G44301 Post-traumatic headache, unspecified, intractable: Secondary | ICD-10-CM | POA: Diagnosis not present

## 2015-06-23 MED ORDER — TOPIRAMATE 50 MG PO TABS: 50.0000 mg | ORAL_TABLET | Freq: Two times a day (BID) | ORAL | Status: DC
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
## 2015-06-23 NOTE — Patient Instructions (Signed)
Cervical Sprain  A cervical sprain is an injury in the neck in which the strong, fibrous tissues (ligaments) that connect your neck bones stretch or tear. Cervical sprains can range from mild to severe. Severe cervical sprains can cause the neck vertebrae to be unstable. This can lead to damage of the spinal cord and can result in serious nervous system problems. The amount of time it takes for a cervical sprain to get better depends on the cause and extent of the injury. Most cervical sprains heal in 1 to 3 weeks.  CAUSES   Severe cervical sprains may be caused by:    Contact sport injuries (such as from football, rugby, wrestling, hockey, auto racing, gymnastics, diving, martial arts, or boxing).    Motor vehicle collisions.    Whiplash injuries. This is an injury from a sudden forward and backward whipping movement of the head and neck.   Falls.   Mild cervical sprains may be caused by:    Being in an awkward position, such as while cradling a telephone between your ear and shoulder.    Sitting in a chair that does not offer proper support.    Working at a poorly designed computer station.    Looking up or down for long periods of time.   SYMPTOMS    Pain, soreness, stiffness, or a burning sensation in the front, back, or sides of the neck. This discomfort may develop immediately after the injury or slowly, 24 hours or more after the injury.    Pain or tenderness directly in the middle of the back of the neck.    Shoulder or upper back pain.    Limited ability to move the neck.    Headache.    Dizziness.    Weakness, numbness, or tingling in the hands or arms.    Muscle spasms.    Difficulty swallowing or chewing.    Tenderness and swelling of the neck.   DIAGNOSIS   Most of the time your health care provider can diagnose a cervical sprain by taking your history and doing a physical exam. Your health care provider will ask about previous neck injuries and any known neck  problems, such as arthritis in the neck. X-rays may be taken to find out if there are any other problems, such as with the bones of the neck. Other tests, such as a CT scan or MRI, may also be needed.   TREATMENT   Treatment depends on the severity of the cervical sprain. Mild sprains can be treated with rest, keeping the neck in place (immobilization), and pain medicines. Severe cervical sprains are immediately immobilized. Further treatment is done to help with pain, muscle spasms, and other symptoms and may include:   Medicines, such as pain relievers, numbing medicines, or muscle relaxants.    Physical therapy. This may involve stretching exercises, strengthening exercises, and posture training. Exercises and improved posture can help stabilize the neck, strengthen muscles, and help stop symptoms from returning.   HOME CARE INSTRUCTIONS    Put ice on the injured area.     Put ice in a plastic bag.     Place a towel between your skin and the bag.     Leave the ice on for 15-20 minutes, 3-4 times a day.    If your injury was severe, you may have been given a cervical collar to wear. A cervical collar is a two-piece collar designed to keep your neck from moving while it heals.      Do not remove the collar unless instructed by your health care provider.    If you have long hair, keep it outside of the collar.    Ask your health care provider before making any adjustments to your collar. Minor adjustments may be required over time to improve comfort and reduce pressure on your chin or on the back of your head.    Ifyou are allowed to remove the collar for cleaning or bathing, follow your health care provider's instructions on how to do so safely.    Keep your collar clean by wiping it with mild soap and water and drying it completely. If the collar you have been given includes removable pads, remove them every 1-2 days and hand wash them with soap and water. Allow them to air dry. They should be completely  dry before you wear them in the collar.    If you are allowed to remove the collar for cleaning and bathing, wash and dry the skin of your neck. Check your skin for irritation or sores. If you see any, tell your health care provider.    Do not drive while wearing the collar.    Only take over-the-counter or prescription medicines for pain, discomfort, or fever as directed by your health care provider.    Keep all follow-up appointments as directed by your health care provider.    Keep all physical therapy appointments as directed by your health care provider.    Make any needed adjustments to your workstation to promote good posture.    Avoid positions and activities that make your symptoms worse.    Warm up and stretch before being active to help prevent problems.   SEEK MEDICAL CARE IF:    Your pain is not controlled with medicine.    You are unable to decrease your pain medicine over time as planned.    Your activity level is not improving as expected.   SEEK IMMEDIATE MEDICAL CARE IF:    You develop any bleeding.   You develop stomach upset.   You have signs of an allergic reaction to your medicine.    Your symptoms get worse.    You develop new, unexplained symptoms.    You have numbness, tingling, weakness, or paralysis in any part of your body.   MAKE SURE YOU:    Understand these instructions.   Will watch your condition.   Will get help right away if you are not doing well or get worse.     This information is not intended to replace advice given to you by your health care provider. Make sure you discuss any questions you have with your health care provider.     Document Released: 06/13/2007 Document Revised: 08/21/2013 Document Reviewed: 02/21/2013  Elsevier Interactive Patient Education 2016 [employer]

## 2015-06-24 ENCOUNTER — Ambulatory Visit (INDEPENDENT_AMBULATORY_CARE_PROVIDER_SITE_OTHER): Admitting: Emergency Medicine

## 2015-06-24 VITALS — BP 116/80 | HR 79 | Temp 98.6°F | Resp 18 | Ht 67.0 in | Wt 169.4 lb

## 2015-06-24 DIAGNOSIS — S161XXD Strain of muscle, fascia and tendon at neck level, subsequent encounter: Secondary | ICD-10-CM

## 2015-06-24 DIAGNOSIS — G44301 Post-traumatic headache, unspecified, intractable: Secondary | ICD-10-CM

## 2015-06-24 NOTE — Patient Instructions (Signed)
Cervical Sprain  A cervical sprain is an injury in the neck in which the strong, fibrous tissues (ligaments) that connect your neck bones stretch or tear. Cervical sprains can range from mild to severe. Severe cervical sprains can cause the neck vertebrae to be unstable. This can lead to damage of the spinal cord and can result in serious nervous system problems. The amount of time it takes for a cervical sprain to get better depends on the cause and extent of the injury. Most cervical sprains heal in 1 to 3 weeks.  CAUSES   Severe cervical sprains may be caused by:    Contact sport injuries (such as from football, rugby, wrestling, hockey, auto racing, gymnastics, diving, martial arts, or boxing).    Motor vehicle collisions.    Whiplash injuries. This is an injury from a sudden forward and backward whipping movement of the head and neck.   Falls.   Mild cervical sprains may be caused by:    Being in an awkward position, such as while cradling a telephone between your ear and shoulder.    Sitting in a chair that does not offer proper support.    Working at a poorly designed computer station.    Looking up or down for long periods of time.   SYMPTOMS    Pain, soreness, stiffness, or a burning sensation in the front, back, or sides of the neck. This discomfort may develop immediately after the injury or slowly, 24 hours or more after the injury.    Pain or tenderness directly in the middle of the back of the neck.    Shoulder or upper back pain.    Limited ability to move the neck.    Headache.    Dizziness.    Weakness, numbness, or tingling in the hands or arms.    Muscle spasms.    Difficulty swallowing or chewing.    Tenderness and swelling of the neck.   DIAGNOSIS   Most of the time your health care provider can diagnose a cervical sprain by taking your history and doing a physical exam. Your health care provider will ask about previous neck injuries and any known neck  problems, such as arthritis in the neck. X-rays may be taken to find out if there are any other problems, such as with the bones of the neck. Other tests, such as a CT scan or MRI, may also be needed.   TREATMENT   Treatment depends on the severity of the cervical sprain. Mild sprains can be treated with rest, keeping the neck in place (immobilization), and pain medicines. Severe cervical sprains are immediately immobilized. Further treatment is done to help with pain, muscle spasms, and other symptoms and may include:   Medicines, such as pain relievers, numbing medicines, or muscle relaxants.    Physical therapy. This may involve stretching exercises, strengthening exercises, and posture training. Exercises and improved posture can help stabilize the neck, strengthen muscles, and help stop symptoms from returning.   HOME CARE INSTRUCTIONS    Put ice on the injured area.     Put ice in a plastic bag.     Place a towel between your skin and the bag.     Leave the ice on for 15-20 minutes, 3-4 times a day.    If your injury was severe, you may have been given a cervical collar to wear. A cervical collar is a two-piece collar designed to keep your neck from moving while it heals.      Do not remove the collar unless instructed by your health care provider.    If you have long hair, keep it outside of the collar.    Ask your health care provider before making any adjustments to your collar. Minor adjustments may be required over time to improve comfort and reduce pressure on your chin or on the back of your head.    Ifyou are allowed to remove the collar for cleaning or bathing, follow your health care provider's instructions on how to do so safely.    Keep your collar clean by wiping it with mild soap and water and drying it completely. If the collar you have been given includes removable pads, remove them every 1-2 days and hand wash them with soap and water. Allow them to air dry. They should be completely  dry before you wear them in the collar.    If you are allowed to remove the collar for cleaning and bathing, wash and dry the skin of your neck. Check your skin for irritation or sores. If you see any, tell your health care provider.    Do not drive while wearing the collar.    Only take over-the-counter or prescription medicines for pain, discomfort, or fever as directed by your health care provider.    Keep all follow-up appointments as directed by your health care provider.    Keep all physical therapy appointments as directed by your health care provider.    Make any needed adjustments to your workstation to promote good posture.    Avoid positions and activities that make your symptoms worse.    Warm up and stretch before being active to help prevent problems.   SEEK MEDICAL CARE IF:    Your pain is not controlled with medicine.    You are unable to decrease your pain medicine over time as planned.    Your activity level is not improving as expected.   SEEK IMMEDIATE MEDICAL CARE IF:    You develop any bleeding.   You develop stomach upset.   You have signs of an allergic reaction to your medicine.    Your symptoms get worse.    You develop new, unexplained symptoms.    You have numbness, tingling, weakness, or paralysis in any part of your body.   MAKE SURE YOU:    Understand these instructions.   Will watch your condition.   Will get help right away if you are not doing well or get worse.     This information is not intended to replace advice given to you by your health care provider. Make sure you discuss any questions you have with your health care provider.     Document Released: 06/13/2007 Document Revised: 08/21/2013 Document Reviewed: 02/21/2013  Elsevier Interactive Patient Education 2016 [employer]
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
## 2016-01-11 IMAGING — US US TRANSVAGINAL NON-OB
1 series · 14 of 25 positions shown · non-contrast
Comparison: None

CLINICAL DATA: Enlarged uterus on physical exam

EXAM:
TRANSABDOMINAL AND TRANSVAGINAL ULTRASOUND OF PELVIS
TECHNIQUE: Both transabdominal and transvaginal ultrasound examinations of the
pelvis were performed. Transabdominal technique was performed for
global imaging of the pelvis including uterus, ovaries, adnexal
regions, and pelvic cul-de-sac. It was necessary to proceed with
endovaginal exam following the transabdominal exam to visualize the
endometrium and left adnexa.

[Series 1: us transvaginal non-ob · 0.37mm/px · 14 of 65 slices shown]
[im 1/65]
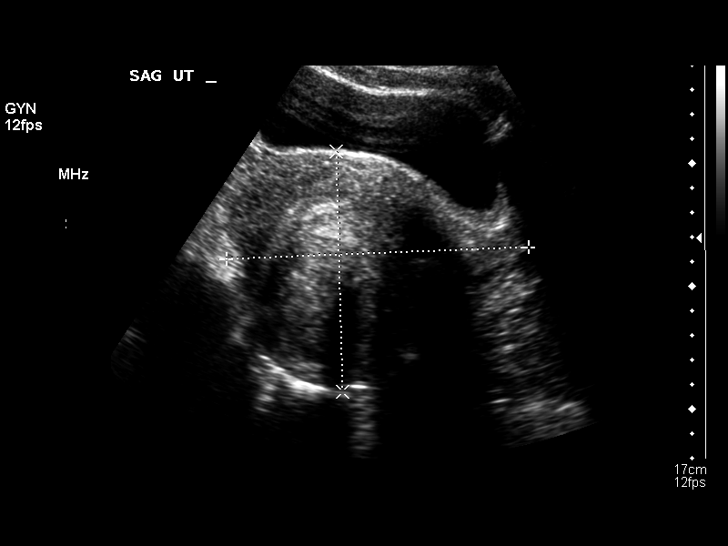
[im 6/65]
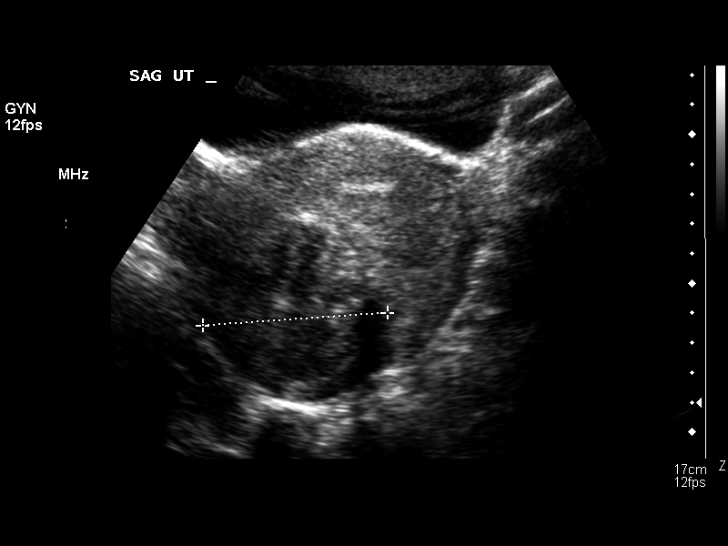
[im 11/65]
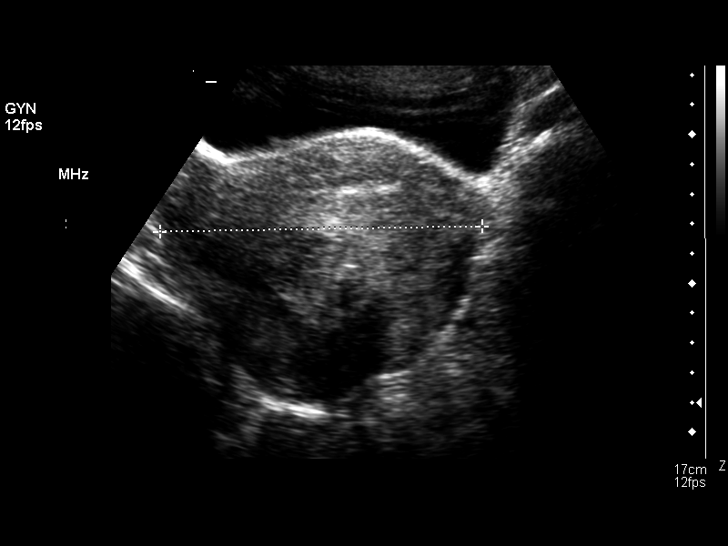
[im 17/65]
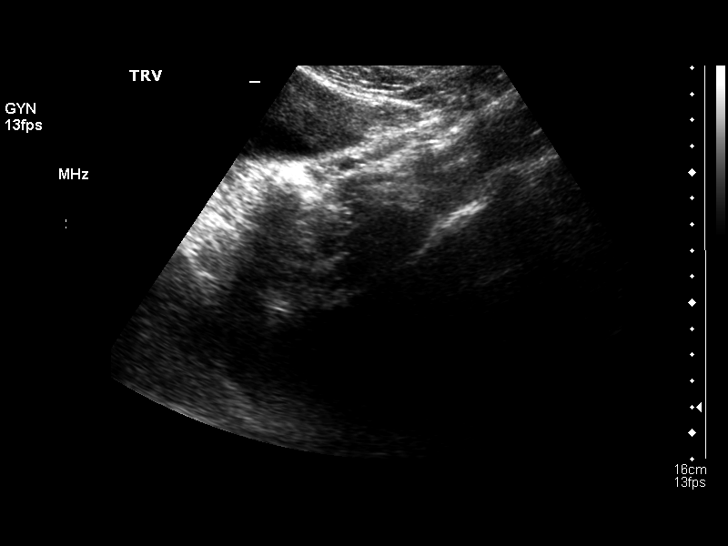
[im 22/65]
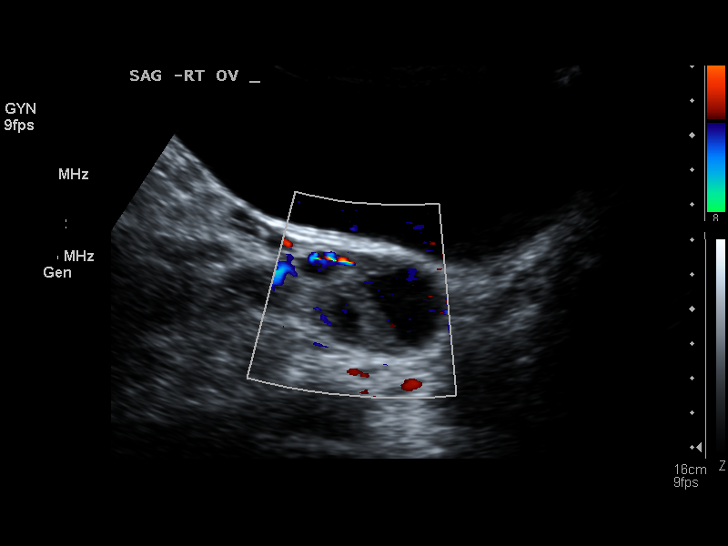
[im 25/65]
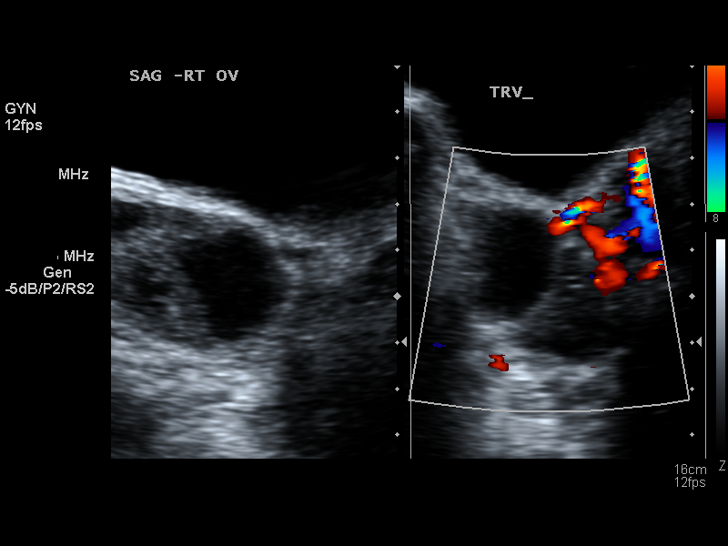
[im 30/65]
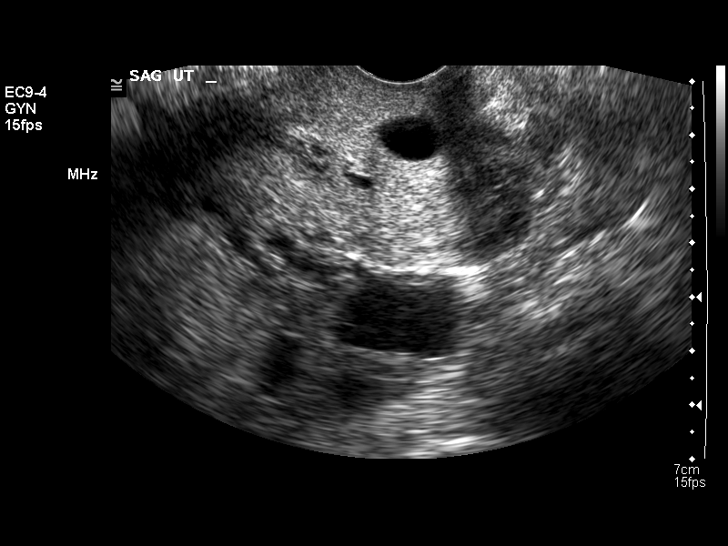
[im 35/65]
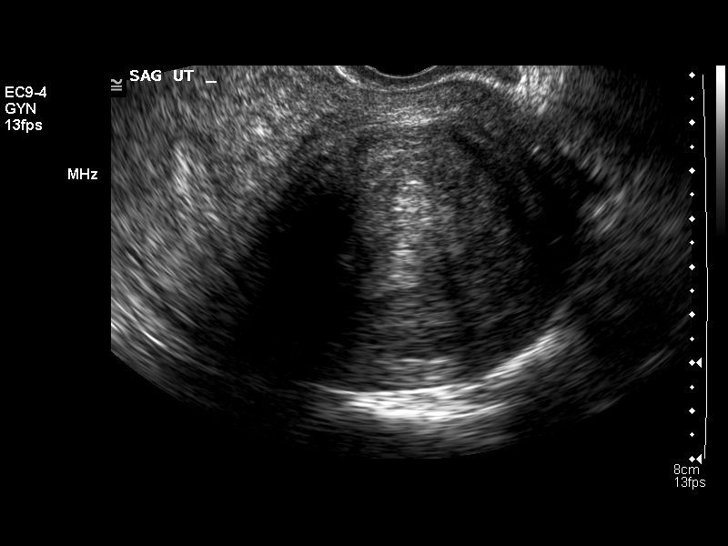
[im 41/65]
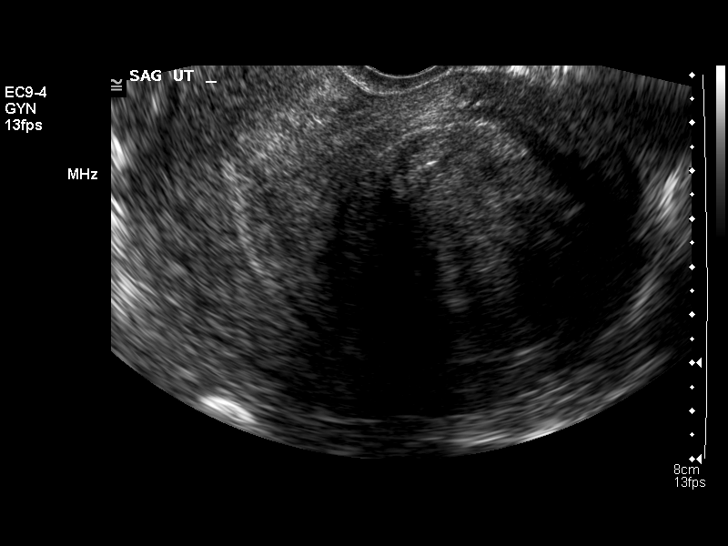
[im 43/65]
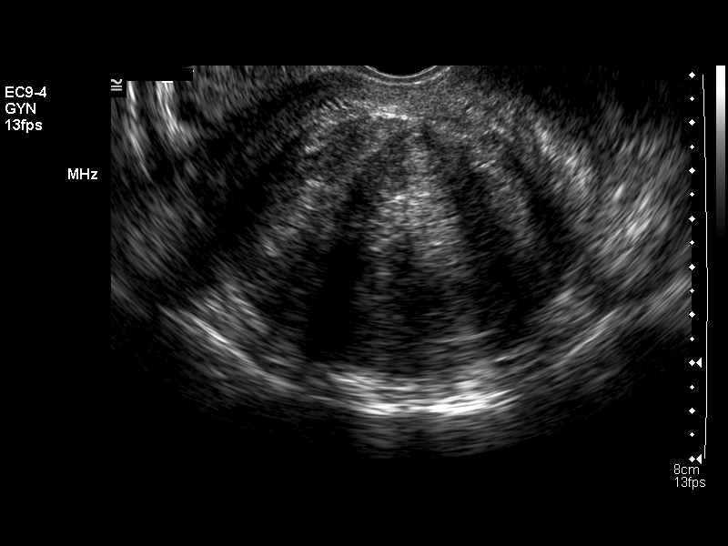
[im 49/65]
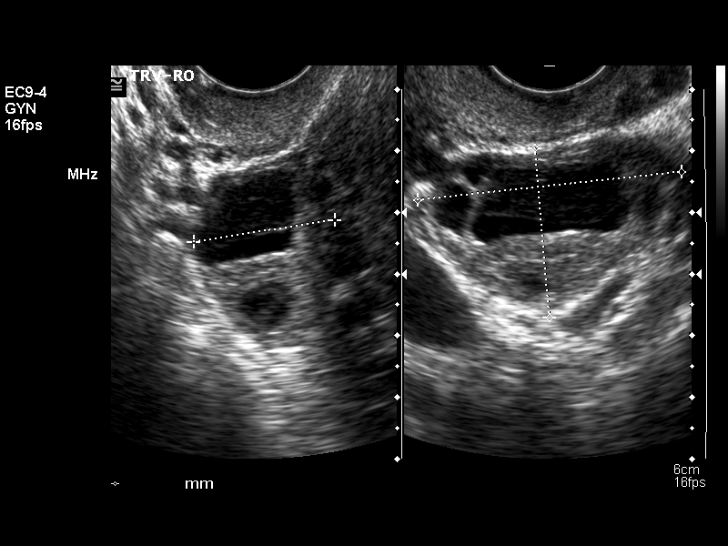
[im 54/65]
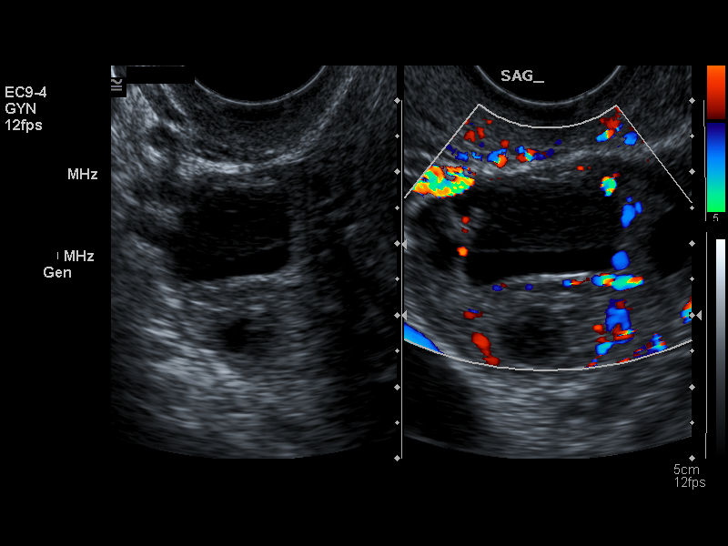
[im 59/65]
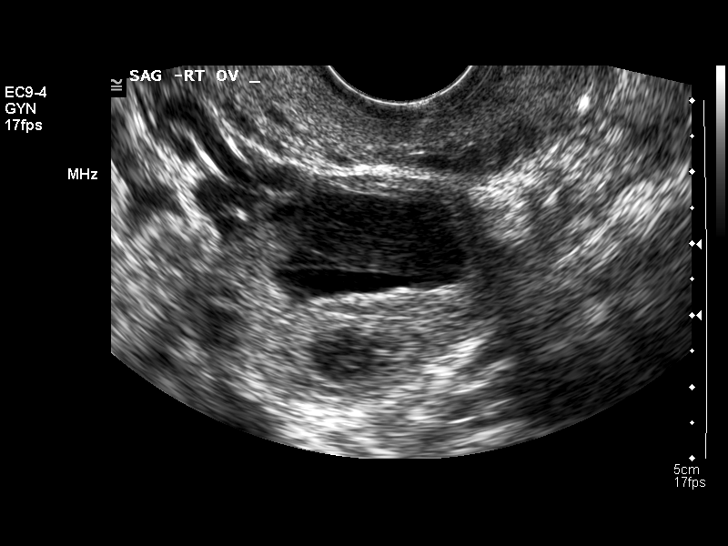
[im 65/65]
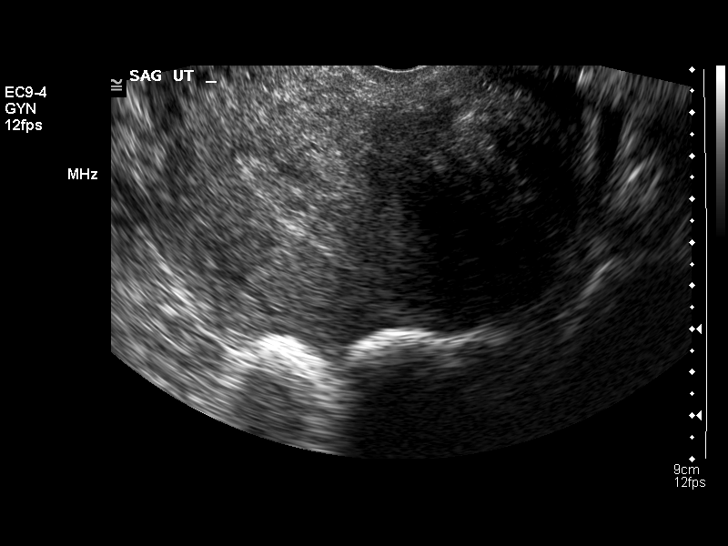

[14 of 25 positions shown; findings below may reference images not displayed]

FINDINGS: Uterus

Measurements: 11.3 x 7.9 x 8.4 cm. 6.2 x 5.7 x 4.9 cm intramural
posterior fundal fibroid.

Endometrium

Thickness: 3 mm.  No focal abnormality visualized.

Right ovary

Measurements: 4.3 x 2.3 x 2.8 cm. Dominant 1.7 x 1.3 x 2.4 cm
hemorrhagic cyst.

Left ovary

Not visualized transabdominally or transvaginally.

Other findings

No free fluid.
IMPRESSION: 6.2 cm intramural posterior fundal fibroid.

2.4 cm right ovarian hemorrhagic cyst, physiologic.

## 2016-01-24 ENCOUNTER — Ambulatory Visit

## 2016-01-24 ENCOUNTER — Ambulatory Visit (INDEPENDENT_AMBULATORY_CARE_PROVIDER_SITE_OTHER): Admitting: Emergency Medicine

## 2016-01-24 ENCOUNTER — Telehealth: Payer: Self-pay | Admitting: Emergency Medicine

## 2016-01-24 VITALS — BP 104/70 | HR 74 | Temp 98.5°F | Resp 17 | Ht 67.75 in | Wt 179.0 lb

## 2016-01-24 DIAGNOSIS — S93401A Sprain of unspecified ligament of right ankle, initial encounter: Secondary | ICD-10-CM

## 2016-01-24 MED ORDER — MELOXICAM 7.5 MG PO TABS: 7.5000 mg | ORAL_TABLET | Freq: Every day | ORAL | Status: DC

## 2016-01-24 NOTE — Telephone Encounter (Signed)
Pt was seen in clinic today by Dr. Cleta Albertsaub.  Pt will need to be called once we get the large sweedos in.  Pt is in need of a new one.  Paper will need to be filled out for ortho supply.

## 2016-01-24 NOTE — Progress Notes (Addendum)
By signing my name below, I, Javier Docker, attest that this documentation has been prepared under the direction and in the presence of Earl Lites, MD. Electronically Signed: Javier Docker, ER Scribe. 01/24/2016. 1:29 PM.  Chief Complaint:  Chief Complaint  Patient presents with  . Work Related Injury    Right ankle sprain x 1 week ago while working   HPI: Sandra Mejia is a 40 y.o. 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 She has been working for the [employer] for 10 years. Her ankle has continued to swell whenever she walks on it. She has been icing her ankle regularly. She is not taking any birth control.   No past medical history on file. Past Surgical History  Procedure Laterality Date  . Foot surgery     Social History   Social History  . Marital Status: Single    Spouse Name: N/A  . Number of Children: N/A  . Years of Education: N/A   Social History Main Topics  . Smoking status: Never Smoker   . Smokeless tobacco: None  . Alcohol Use: None  . Drug Use: None  . Sexual Activity: Not Asked   Other Topics Concern  . None   Social History Narrative   No family history on file. No Known Allergies Prior to Admission medications   Medication Sig Start Date End Date Taking? Authorizing Provider  escitalopram (LEXAPRO) 5 MG tablet Take 5 mg by mouth daily.   Yes Historical Provider, MD  topiramate (TOPAMAX) 50 MG tablet Take 1 tablet (50 mg total) by mouth 2 (two) times daily. 06/23/15  Yes Elvina Sidle, MD     ROS: The patient denies fevers, chills, night sweats, unintentional weight loss, chest pain, palpitations, wheezing, dyspnea on exertion, nausea, vomiting, abdominal pain, dysuria, hematuria, melena, numbness, weakness, or tingling.   All other systems have been reviewed and were otherwise negative with the  exception of those mentioned in the HPI and as above.    PHYSICAL EXAM: Filed Vitals:   01/24/16 1213  BP: 104/70  Pulse: 74  Temp: 98.5 F (36.9 C)  Resp: 17   Body mass index is 27.41 kg/(m^2).   General: Alert, no acute distress HEENT:  Normocephalic, atraumatic, oropharynx patent. Eye: Nonie Hoyer [facility] Cardiovascular:  Regular rate and rhythm, no rubs murmurs or gallops.  No Carotid bruits, radial pulse intact. No pedal edema.  Respiratory: Clear to auscultation bilaterally.  No wheezes, rales, or rhonchi.  No cyanosis, no use of accessory musculature Abdominal: No organomegaly, abdomen is soft and non-tender, positive bowel sounds.  No masses. Musculoskeletal: Gait intact. No edema, tenderness. TTP lateral ankle over the anterior talofibular ligament. There is some weakenss to inversion and eversion. There is very mild tenderness in the lower third of the calf muscle, but no swelling in the area.  Skin: No rashes. Neurologic: Facial musculature symmetric. Psychiatric: Patient acts appropriately throughout our interaction. Lymphatic: No cervical or submandibular lymphadenopathy    LABS:    EKG/XRAY:   Primary read interpreted by Dr. Cleta Alberts at [facility].  Dg Ankle Complete Right  01/24/2016  CLINICAL DATA:  40 year old female with injury to the right ankle complaining of right ankle pain. EXAM: RIGHT ANKLE - COMPLETE 3+ VIEW COMPARISON:  No priors. FINDINGS: Three views of the right ankle demonstrate no acute displaced fracture, subluxation  or dislocation. Mild soft tissue swelling overlying the lateral malleolus. IMPRESSION: 1. No acute radiographic abnormality of the bones of the right ankle. 2. Mild soft tissue swelling overlying the lateral malleolus. Electronically Signed   By: Trudie Reedaniel  Entrikin M.D.   On: 01/24/2016 13:57     Assemment and Plan:  We'll place patient in the new ankle brace. She was advised to take meloxicam as an anti-inflammatory. She was placed on light duty.  Referral made to Dr. Bunnie Dominoumont ski's office where she has been evaluated for her recent neck injury. They can do physical therapy and rehabilitation through their office.I personally performed the services described in this documentation, which was scribed in my presence. The recorded information has been reviewed and is accurate.

## 2016-01-24 NOTE — Patient Instructions (Addendum)
Referral made to orthopedics. She will be on meloxicam 7.5 daily. She will be placed off work.    IF you received an x-ray today, you will receive an invoice from [facility]. Please contact [facility] at [phone] with questions or concerns regarding your invoice.   IF you received labwork today, you will receive an invoice from [employer]. Please contact [facility] at [phone] with questions or concerns regarding your invoice.   Our billing staff will not be able to assist you with questions regarding bills from these companies.  You will be contacted with the lab results as soon as they are available. The fastest way to get your results is to activate your My Chart account. Instructions are located on the last page of this paperwork. If you have not heard from [place] regarding the results in 2 weeks, please contact this office.     Ankle Sprain An ankle sprain is an injury to the strong, fibrous tissues (ligaments) that hold the bones of your ankle joint together.  CAUSES An ankle sprain is usually caused by a fall or by twisting your ankle. Ankle sprains most commonly occur when you step on the outer edge of your foot, and your ankle turns inward. People who participate in sports are more prone to these types of injuries.  SYMPTOMS   Pain in your ankle. The pain may be present at rest or only when you are trying to stand or walk.  Swelling.  Bruising. Bruising may develop immediately or within 1 to 2 days after your injury.  Difficulty standing or walking, particularly when turning corners or changing directions. DIAGNOSIS  Your caregiver will ask you details about your injury and perform a physical exam of your ankle to determine if you have an ankle sprain. During the physical exam, your caregiver will press on and apply pressure to specific areas of your foot and ankle. Your caregiver will try to move your ankle in certain ways. An  X-ray exam may be done to be sure a bone was not broken or a ligament did not separate from one of the bones in your ankle (avulsion fracture).  TREATMENT  Certain types of braces can help stabilize your ankle. Your caregiver can make a recommendation for this. Your caregiver may recommend the use of medicine for pain. If your sprain is severe, your caregiver may refer you to a surgeon who helps to restore function to parts of your skeletal system (orthopedist) or a physical therapist. HOME CARE INSTRUCTIONS   Apply ice to your injury for 1-2 days or as directed by your caregiver. Applying ice helps to reduce inflammation and pain.  Put ice in a plastic bag.  Place a towel between your skin and the bag.  Leave the ice on for 15-20 minutes at a time, every 2 hours while you are awake.  Only take over-the-counter or prescription medicines for pain, discomfort, or fever as directed by your caregiver.  Elevate your injured ankle above the level of your heart as much as possible for 2-3 days.  If your caregiver recommends crutches, use them as instructed. Gradually put weight on the affected ankle. Continue to use crutches or a cane until you can walk without feeling pain in your ankle.  If you have a plaster splint, wear the splint as directed by your caregiver. Do not rest it on anything harder than a pillow for the first 24 hours. Do not put weight on it. Do not get it  wet. You may take it off to take a shower or bath.  You may have been given an elastic bandage to wear around your ankle to provide support. If the elastic bandage is too tight (you have numbness or tingling in your foot or your foot becomes cold and blue), adjust the bandage to make it comfortable.  If you have an air splint, you may blow more air into it or let air out to make it more comfortable. You may take your splint off at night and before taking a shower or bath. Wiggle your toes in the splint several times per day to  decrease swelling. SEEK MEDICAL CARE IF:   You have rapidly increasing bruising or swelling.  Your toes feel extremely cold or you lose feeling in your foot.  Your pain is not relieved with medicine. SEEK IMMEDIATE MEDICAL CARE IF:  Your toes are numb or blue.  You have severe pain that is increasing. MAKE SURE YOU:   Understand these instructions.  Will watch your condition.  Will get help right away if you are not doing well or get worse.   This information is not intended to replace advice given to you by your health care provider. Make sure you discuss any questions you have with your health care provider.   Document Released: 08/16/2005 Document Revised: 09/06/2014 Document Reviewed: 08/28/2011 Elsevier Interactive Patient Education [employer]2016 Elsevier Inc.

## 2016-02-05 NOTE — Telephone Encounter (Signed)
Advised pt ready to pick up. 

## 2016-08-25 ENCOUNTER — Encounter: Payer: Self-pay | Admitting: *Deleted
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
## 2020-09-08 ENCOUNTER — Other Ambulatory Visit: Payer: Self-pay | Admitting: Family Medicine

## 2020-09-08 DIAGNOSIS — N920 Excessive and frequent menstruation with regular cycle: Secondary | ICD-10-CM

## 2020-09-08 DIAGNOSIS — N852 Hypertrophy of uterus: Secondary | ICD-10-CM

## 2020-09-24 ENCOUNTER — Ambulatory Visit
Admission: RE | Admit: 2020-09-24 | Discharge: 2020-09-24 | Disposition: A | Discharge status: Home / Self Care | Payer: 59 | Source: Ambulatory Visit | Attending: Family Medicine | Admitting: Family Medicine

## 2020-09-24 DIAGNOSIS — N920 Excessive and frequent menstruation with regular cycle: Secondary | ICD-10-CM

## 2020-09-24 DIAGNOSIS — N852 Hypertrophy of uterus: Secondary | ICD-10-CM

## 2021-03-09 ENCOUNTER — Other Ambulatory Visit: Payer: Self-pay | Admitting: Obstetrics and Gynecology

## 2021-03-09 DIAGNOSIS — R928 Other abnormal and inconclusive findings on diagnostic imaging of breast: Secondary | ICD-10-CM

## 2021-03-27 ENCOUNTER — Ambulatory Visit
Admission: RE | Admit: 2021-03-27 | Discharge: 2021-03-27 | Disposition: A | Discharge status: Home / Self Care | Payer: 59 | Source: Ambulatory Visit | Attending: Obstetrics and Gynecology | Admitting: Obstetrics and Gynecology

## 2021-03-27 ENCOUNTER — Other Ambulatory Visit: Payer: Self-pay

## 2021-03-27 ENCOUNTER — Other Ambulatory Visit: Payer: Self-pay | Admitting: Obstetrics and Gynecology

## 2021-03-27 DIAGNOSIS — R928 Other abnormal and inconclusive findings on diagnostic imaging of breast: Secondary | ICD-10-CM

## 2021-03-27 DIAGNOSIS — N6320 Unspecified lump in the left breast, unspecified quadrant: Secondary | ICD-10-CM

## 2021-09-28 ENCOUNTER — Inpatient Hospital Stay: Admission: RE | Admit: 2021-09-28 | Payer: 59 | Source: Ambulatory Visit

## 2021-10-07 ENCOUNTER — Ambulatory Visit
Admission: RE | Admit: 2021-10-07 | Discharge: 2021-10-07 | Disposition: A | Discharge status: Home / Self Care | Payer: 59 | Source: Ambulatory Visit | Attending: Obstetrics and Gynecology | Admitting: Obstetrics and Gynecology

## 2021-10-07 ENCOUNTER — Other Ambulatory Visit: Payer: Self-pay | Admitting: Obstetrics and Gynecology

## 2021-10-07 ENCOUNTER — Other Ambulatory Visit: Payer: Self-pay

## 2021-10-07 DIAGNOSIS — N6320 Unspecified lump in the left breast, unspecified quadrant: Secondary | ICD-10-CM

## 2021-12-26 ENCOUNTER — Encounter (HOSPITAL_COMMUNITY): Payer: Self-pay | Admitting: Emergency Medicine

## 2021-12-26 ENCOUNTER — Encounter (HOSPITAL_COMMUNITY): Admission: EM | Disposition: A | Discharge status: Home Health | Payer: Self-pay | Source: Home / Self Care

## 2021-12-26 ENCOUNTER — Emergency Department (HOSPITAL_COMMUNITY): Payer: 59

## 2021-12-26 ENCOUNTER — Inpatient Hospital Stay (HOSPITAL_COMMUNITY): Payer: 59 | Admitting: Anesthesiology

## 2021-12-26 ENCOUNTER — Inpatient Hospital Stay (HOSPITAL_COMMUNITY): Payer: 59

## 2021-12-26 ENCOUNTER — Inpatient Hospital Stay (HOSPITAL_COMMUNITY)
Admission: EM | Admit: 2021-12-26 | Discharge: 2022-01-06 | DRG: 356 | Disposition: A | Discharge status: Home Health | Payer: 59 | Attending: General Surgery | Admitting: General Surgery

## 2021-12-26 ENCOUNTER — Other Ambulatory Visit: Payer: Self-pay

## 2021-12-26 DIAGNOSIS — R339 Retention of urine, unspecified: Secondary | ICD-10-CM | POA: Diagnosis present

## 2021-12-26 DIAGNOSIS — S270XXA Traumatic pneumothorax, initial encounter: Secondary | ICD-10-CM | POA: Diagnosis present

## 2021-12-26 DIAGNOSIS — S2242XA Multiple fractures of ribs, left side, initial encounter for closed fracture: Secondary | ICD-10-CM | POA: Diagnosis present

## 2021-12-26 DIAGNOSIS — S32049A Unspecified fracture of fourth lumbar vertebra, initial encounter for closed fracture: Secondary | ICD-10-CM | POA: Diagnosis present

## 2021-12-26 DIAGNOSIS — Z23 Encounter for immunization: Secondary | ICD-10-CM

## 2021-12-26 DIAGNOSIS — K581 Irritable bowel syndrome with constipation: Secondary | ICD-10-CM | POA: Diagnosis present

## 2021-12-26 DIAGNOSIS — S36232A Laceration of tail of pancreas, unspecified degree, initial encounter: Secondary | ICD-10-CM | POA: Diagnosis present

## 2021-12-26 DIAGNOSIS — R000 Tachycardia, unspecified: Secondary | ICD-10-CM | POA: Diagnosis present

## 2021-12-26 DIAGNOSIS — J9601 Acute respiratory failure with hypoxia: Secondary | ICD-10-CM | POA: Diagnosis present

## 2021-12-26 DIAGNOSIS — E2749 Other adrenocortical insufficiency: Secondary | ICD-10-CM | POA: Diagnosis present

## 2021-12-26 DIAGNOSIS — S81012A Laceration without foreign body, left knee, initial encounter: Secondary | ICD-10-CM | POA: Diagnosis present

## 2021-12-26 DIAGNOSIS — S81011A Laceration without foreign body, right knee, initial encounter: Secondary | ICD-10-CM | POA: Diagnosis present

## 2021-12-26 DIAGNOSIS — S36031A Moderate laceration of spleen, initial encounter: Secondary | ICD-10-CM | POA: Diagnosis present

## 2021-12-26 DIAGNOSIS — K567 Ileus, unspecified: Secondary | ICD-10-CM | POA: Diagnosis not present

## 2021-12-26 DIAGNOSIS — D62 Acute posthemorrhagic anemia: Secondary | ICD-10-CM | POA: Diagnosis not present

## 2021-12-26 DIAGNOSIS — Y92410 Unspecified street and highway as the place of occurrence of the external cause: Secondary | ICD-10-CM | POA: Diagnosis not present

## 2021-12-26 DIAGNOSIS — R7989 Other specified abnormal findings of blood chemistry: Secondary | ICD-10-CM

## 2021-12-26 DIAGNOSIS — Z20822 Contact with and (suspected) exposure to covid-19: Secondary | ICD-10-CM | POA: Diagnosis present

## 2021-12-26 DIAGNOSIS — S36269A Major laceration of unspecified part of pancreas, initial encounter: Secondary | ICD-10-CM | POA: Diagnosis not present

## 2021-12-26 DIAGNOSIS — K661 Hemoperitoneum: Principal | ICD-10-CM | POA: Diagnosis present

## 2021-12-26 DIAGNOSIS — E876 Hypokalemia: Secondary | ICD-10-CM | POA: Diagnosis present

## 2021-12-26 DIAGNOSIS — S3613XA Injury of bile duct, initial encounter: Secondary | ICD-10-CM | POA: Diagnosis not present

## 2021-12-26 DIAGNOSIS — S36039A Unspecified laceration of spleen, initial encounter: Secondary | ICD-10-CM

## 2021-12-26 DIAGNOSIS — S32009A Unspecified fracture of unspecified lumbar vertebra, initial encounter for closed fracture: Secondary | ICD-10-CM

## 2021-12-26 DIAGNOSIS — S32019A Unspecified fracture of first lumbar vertebra, initial encounter for closed fracture: Secondary | ICD-10-CM | POA: Diagnosis present

## 2021-12-26 HISTORY — PX: IRRIGATION AND DEBRIDEMENT KNEE: SHX5185

## 2021-12-26 HISTORY — PX: LAPAROTOMY: SHX154

## 2021-12-26 LAB — BASIC METABOLIC PANEL
Anion gap: 12 (ref 5–15)
BUN: 13 mg/dL (ref 6–20)
CO2: 13 mmol/L — ABNORMAL LOW (ref 22–32)
Calcium: 8 mg/dL — ABNORMAL LOW (ref 8.9–10.3)
Chloride: 113 mmol/L — ABNORMAL HIGH (ref 98–111)
Creatinine, Ser: 1.04 mg/dL — ABNORMAL HIGH (ref 0.44–1.00)
GFR, Estimated: >60 mL/min (ref >60)
Glucose, Bld: 85 mg/dL (ref 70–99)
Potassium: 5.2 mmol/L — ABNORMAL HIGH (ref 3.5–5.1)
Sodium: 138 mmol/L (ref 135–145)

## 2021-12-26 LAB — COMPREHENSIVE METABOLIC PANEL
ALT: 75 U/L — ABNORMAL HIGH (ref 0–44)
ALT: 69 U/L — ABNORMAL HIGH (ref 0–44)
AST: 99 U/L — ABNORMAL HIGH (ref 15–41)
AST: 123 U/L — ABNORMAL HIGH (ref 15–41)
Albumin: 3.7 g/dL (ref 3.5–5.0)
Albumin: 3.2 g/dL — ABNORMAL LOW (ref 3.5–5.0)
Alkaline Phosphatase: 51 U/L (ref 38–126)
Alkaline Phosphatase: 44 U/L (ref 38–126)
Anion gap: 8 (ref 5–15)
Anion gap: 11 (ref 5–15)
BUN: 14 mg/dL (ref 6–20)
BUN: 14 mg/dL (ref 6–20)
CO2: 19 mmol/L — ABNORMAL LOW (ref 22–32)
CO2: 16 mmol/L — ABNORMAL LOW (ref 22–32)
Calcium: 8.5 mg/dL — ABNORMAL LOW (ref 8.9–10.3)
Calcium: 7.9 mg/dL — ABNORMAL LOW (ref 8.9–10.3)
Chloride: 110 mmol/L (ref 98–111)
Chloride: 112 mmol/L — ABNORMAL HIGH (ref 98–111)
Creatinine, Ser: 1.21 mg/dL — ABNORMAL HIGH (ref 0.44–1.00)
Creatinine, Ser: 1.09 mg/dL — ABNORMAL HIGH (ref 0.44–1.00)
GFR, Estimated: 56 mL/min — ABNORMAL LOW (ref >60)
GFR, Estimated: >60 mL/min (ref >60)
Glucose, Bld: 183 mg/dL — ABNORMAL HIGH (ref 70–99)
Glucose, Bld: 129 mg/dL — ABNORMAL HIGH (ref 70–99)
Potassium: 2.9 mmol/L — ABNORMAL LOW (ref 3.5–5.1)
Potassium: 5 mmol/L (ref 3.5–5.1)
Sodium: 137 mmol/L (ref 135–145)
Sodium: 139 mmol/L (ref 135–145)
Total Bilirubin: 0.4 mg/dL (ref 0.3–1.2)
Total Bilirubin: 0.5 mg/dL (ref 0.3–1.2)
Total Protein: 6.6 g/dL (ref 6.5–8.1)
Total Protein: 5.6 g/dL — ABNORMAL LOW (ref 6.5–8.1)

## 2021-12-26 LAB — LACTIC ACID, PLASMA
Lactic Acid, Venous: 3.8 mmol/L (ref 0.5–1.9)
Lactic Acid, Venous: 5.6 mmol/L (ref 0.5–1.9)

## 2021-12-26 LAB — CBC
HCT: 35.9 % — ABNORMAL LOW (ref 36.0–46.0)
HCT: 33.2 % — ABNORMAL LOW (ref 36.0–46.0)
HCT: 33.3 % — ABNORMAL LOW (ref 36.0–46.0)
HCT: 24.7 % — ABNORMAL LOW (ref 36.0–46.0)
Hemoglobin: 10.8 g/dL — ABNORMAL LOW (ref 12.0–15.0)
Hemoglobin: 9.5 g/dL — ABNORMAL LOW (ref 12.0–15.0)
Hemoglobin: 9.7 g/dL — ABNORMAL LOW (ref 12.0–15.0)
Hemoglobin: 7.8 g/dL — ABNORMAL LOW (ref 12.0–15.0)
MCH: 23.4 pg — ABNORMAL LOW (ref 26.0–34.0)
MCH: 23.1 pg — ABNORMAL LOW (ref 26.0–34.0)
MCH: 23.7 pg — ABNORMAL LOW (ref 26.0–34.0)
MCH: 23.6 pg — ABNORMAL LOW (ref 26.0–34.0)
MCHC: 30.1 g/dL (ref 30.0–36.0)
MCHC: 28.6 g/dL — ABNORMAL LOW (ref 30.0–36.0)
MCHC: 29.1 g/dL — ABNORMAL LOW (ref 30.0–36.0)
MCHC: 31.6 g/dL (ref 30.0–36.0)
MCV: 77.7 fL — ABNORMAL LOW (ref 80.0–100.0)
MCV: 80.8 fL (ref 80.0–100.0)
MCV: 81.2 fL (ref 80.0–100.0)
MCV: 74.8 fL — ABNORMAL LOW (ref 80.0–100.0)
Platelets: 309 10*3/uL (ref 150–400)
Platelets: 240 10*3/uL (ref 150–400)
Platelets: 224 10*3/uL (ref 150–400)
Platelets: 189 10*3/uL (ref 150–400)
RBC: 4.62 MIL/uL (ref 3.87–5.11)
RBC: 4.11 MIL/uL (ref 3.87–5.11)
RBC: 4.1 MIL/uL (ref 3.87–5.11)
RBC: 3.3 MIL/uL — ABNORMAL LOW (ref 3.87–5.11)
RDW: 20.2 % — ABNORMAL HIGH (ref 11.5–15.5)
RDW: 20.4 % — ABNORMAL HIGH (ref 11.5–15.5)
RDW: 20.5 % — ABNORMAL HIGH (ref 11.5–15.5)
RDW: 20.3 % — ABNORMAL HIGH (ref 11.5–15.5)
WBC: 13.3 10*3/uL — ABNORMAL HIGH (ref 4.0–10.5)
WBC: 17.6 10*3/uL — ABNORMAL HIGH (ref 4.0–10.5)
WBC: 15.8 10*3/uL — ABNORMAL HIGH (ref 4.0–10.5)
WBC: 8.7 10*3/uL (ref 4.0–10.5)
nRBC: 0 % (ref 0.0–0.2)
nRBC: 0 % (ref 0.0–0.2)
nRBC: 0 % (ref 0.0–0.2)
nRBC: 0 % (ref 0.0–0.2)

## 2021-12-26 LAB — I-STAT CHEM 8, ED
BUN: 15 mg/dL (ref 6–20)
Calcium, Ion: 1.11 mmol/L — ABNORMAL LOW (ref 1.15–1.40)
Chloride: 107 mmol/L (ref 98–111)
Creatinine, Ser: 1.2 mg/dL — ABNORMAL HIGH (ref 0.44–1.00)
Glucose, Bld: 186 mg/dL — ABNORMAL HIGH (ref 70–99)
HCT: 36 % (ref 36.0–46.0)
Hemoglobin: 12.2 g/dL (ref 12.0–15.0)
Potassium: 2.9 mmol/L — ABNORMAL LOW (ref 3.5–5.1)
Sodium: 140 mmol/L (ref 135–145)
TCO2: 20 mmol/L — ABNORMAL LOW (ref 22–32)

## 2021-12-26 LAB — URINALYSIS, ROUTINE W REFLEX MICROSCOPIC
Bilirubin Urine: NEGATIVE
Glucose, UA: 150 mg/dL — AB
Ketones, ur: NEGATIVE mg/dL
Leukocytes,Ua: NEGATIVE
Nitrite: NEGATIVE
Protein, ur: 30 mg/dL — AB
RBC / HPF: >50 RBC/hpf — ABNORMAL HIGH (ref 0–5)
Specific Gravity, Urine: 1.024 (ref 1.005–1.030)
pH: 6 (ref 5.0–8.0)

## 2021-12-26 LAB — RESP PANEL BY RT-PCR (FLU A&B, COVID) ARPGX2
Influenza A by PCR: NEGATIVE
Influenza B by PCR: NEGATIVE
SARS Coronavirus 2 by RT PCR: NEGATIVE

## 2021-12-26 LAB — HIV ANTIBODY (ROUTINE TESTING W REFLEX): HIV Screen 4th Generation wRfx: NONREACTIVE

## 2021-12-26 LAB — POCT I-STAT, CHEM 8
BUN: 10 mg/dL (ref 6–20)
Calcium, Ion: 1.12 mmol/L — ABNORMAL LOW (ref 1.15–1.40)
Chloride: 108 mmol/L (ref 98–111)
Creatinine, Ser: 0.7 mg/dL (ref 0.44–1.00)
Glucose, Bld: 129 mg/dL — ABNORMAL HIGH (ref 70–99)
HCT: 25 % — ABNORMAL LOW (ref 36.0–46.0)
Hemoglobin: 8.5 g/dL — ABNORMAL LOW (ref 12.0–15.0)
Potassium: 3.9 mmol/L (ref 3.5–5.1)
Sodium: 138 mmol/L (ref 135–145)
TCO2: 20 mmol/L — ABNORMAL LOW (ref 22–32)

## 2021-12-26 LAB — PREPARE RBC (CROSSMATCH)

## 2021-12-26 LAB — SURGICAL PCR SCREEN
MRSA, PCR: POSITIVE — AB
Staphylococcus aureus: POSITIVE — AB

## 2021-12-26 LAB — ABO/RH: ABO/RH(D): O POS

## 2021-12-26 LAB — I-STAT ARTERIAL BLOOD GAS, ED
Acid-base deficit: 5 mmol/L — ABNORMAL HIGH (ref 0.0–2.0)
Bicarbonate: 18.9 mmol/L — ABNORMAL LOW (ref 20.0–28.0)
Calcium, Ion: 1.08 mmol/L — ABNORMAL LOW (ref 1.15–1.40)
HCT: 32 % — ABNORMAL LOW (ref 36.0–46.0)
Hemoglobin: 10.9 g/dL — ABNORMAL LOW (ref 12.0–15.0)
O2 Saturation: 100 %
Patient temperature: 98.7
Potassium: 2.7 mmol/L — CL (ref 3.5–5.1)
Sodium: 139 mmol/L (ref 135–145)
TCO2: 20 mmol/L — ABNORMAL LOW (ref 22–32)
pCO2 arterial: 32 mmHg (ref 32–48)
pH, Arterial: 7.378 (ref 7.35–7.45)
pO2, Arterial: 413 mmHg — ABNORMAL HIGH (ref 83–108)

## 2021-12-26 LAB — SAMPLE TO BLOOD BANK

## 2021-12-26 LAB — PROTIME-INR
INR: 1.1 (ref 0.8–1.2)
Prothrombin Time: 13.8 seconds (ref 11.4–15.2)

## 2021-12-26 LAB — ETHANOL: Alcohol, Ethyl (B): <10 mg/dL (ref <10)

## 2021-12-26 SURGERY — IRRIGATION AND DEBRIDEMENT EXTREMITY
Anesthesia: General | Laterality: Right

## 2021-12-26 SURGERY — LAPAROTOMY, EXPLORATORY
Anesthesia: General | Site: Knee

## 2021-12-26 MED ORDER — PROPOFOL 1000 MG/100ML IV EMUL
INTRAVENOUS | Status: AC | PRN
  Administered 2021-12-26: 812 ug via INTRAVENOUS

## 2021-12-26 MED ORDER — MIDAZOLAM HCL 5 MG/5ML IJ SOLN
INTRAMUSCULAR | Status: DC | PRN
  Administered 2021-12-26: 2 mg via INTRAVENOUS

## 2021-12-26 MED ORDER — ORAL CARE MOUTH RINSE
15.0000 mL | OROMUCOSAL | Status: DC
  Administered 2021-12-26 – 2021-12-27 (×13): 15 mL via OROMUCOSAL

## 2021-12-26 MED ORDER — ROCURONIUM BROMIDE 10 MG/ML (PF) SYRINGE
PREFILLED_SYRINGE | INTRAVENOUS | Status: AC
  Filled 2021-12-26: qty 10

## 2021-12-26 MED ORDER — ETOMIDATE 2 MG/ML IV SOLN
INTRAVENOUS | Status: AC | PRN
  Administered 2021-12-26: 20 mg via INTRAVENOUS

## 2021-12-26 MED ORDER — SODIUM CHLORIDE 0.9% IV SOLUTION: Freq: Once | INTRAVENOUS | Status: DC

## 2021-12-26 MED ORDER — ONDANSETRON HCL 4 MG/2ML IJ SOLN
INTRAMUSCULAR | Status: DC | PRN
  Administered 2021-12-26: 4 mg via INTRAVENOUS

## 2021-12-26 MED ORDER — CEFAZOLIN SODIUM-DEXTROSE 2-4 GM/100ML-% IV SOLN
2.0000 g | INTRAVENOUS | Status: DC
  Filled 2021-12-26: qty 100

## 2021-12-26 MED ORDER — MUPIROCIN 2 % EX OINT
1.0000 "application " | TOPICAL_OINTMENT | Freq: Two times a day (BID) | CUTANEOUS | Status: AC
  Administered 2021-12-26 – 2021-12-30 (×9): 1 via NASAL
  Filled 2021-12-26 (×2): qty 22

## 2021-12-26 MED ORDER — ROCURONIUM BROMIDE 10 MG/ML (PF) SYRINGE
PREFILLED_SYRINGE | INTRAVENOUS | Status: AC
  Filled 2021-12-26: qty 20

## 2021-12-26 MED ORDER — FENTANYL CITRATE (PF) 250 MCG/5ML IJ SOLN
INTRAMUSCULAR | Status: AC
  Filled 2021-12-26: qty 5

## 2021-12-26 MED ORDER — POVIDONE-IODINE 10 % EX SWAB: 2.0000 "application " | Freq: Once | CUTANEOUS | Status: DC

## 2021-12-26 MED ORDER — MIDAZOLAM HCL 2 MG/2ML IJ SOLN
INTRAMUSCULAR | Status: AC
  Filled 2021-12-26: qty 2

## 2021-12-26 MED ORDER — VANCOMYCIN HCL 500 MG IV SOLR
INTRAVENOUS | Status: AC
  Filled 2021-12-26: qty 10

## 2021-12-26 MED ORDER — LACTATED RINGERS IV SOLN: INTRAVENOUS | Status: DC | PRN

## 2021-12-26 MED ORDER — FENTANYL 2500MCG IN NS 250ML (10MCG/ML) PREMIX INFUSION
INTRAVENOUS | Status: AC
  Administered 2021-12-26: 50 ug/h via INTRAVENOUS
  Filled 2021-12-26: qty 250

## 2021-12-26 MED ORDER — CEFAZOLIN SODIUM-DEXTROSE 2-3 GM-%(50ML) IV SOLR
INTRAVENOUS | Status: DC | PRN
  Administered 2021-12-26: 2 g via INTRAVENOUS

## 2021-12-26 MED ORDER — PROPOFOL 10 MG/ML IV BOLUS
INTRAVENOUS | Status: AC
  Filled 2021-12-26: qty 20

## 2021-12-26 MED ORDER — ROCURONIUM BROMIDE 10 MG/ML (PF) SYRINGE
PREFILLED_SYRINGE | INTRAVENOUS | Status: DC | PRN
  Administered 2021-12-26: 50 mg via INTRAVENOUS
  Administered 2021-12-26: 30 mg via INTRAVENOUS
  Administered 2021-12-26: 50 mg via INTRAVENOUS

## 2021-12-26 MED ORDER — POTASSIUM CHLORIDE IN NACL 20-0.9 MEQ/L-% IV SOLN
INTRAVENOUS | Status: DC
  Filled 2021-12-26 (×5): qty 1000

## 2021-12-26 MED ORDER — PANTOPRAZOLE SODIUM 40 MG IV SOLR
40.0000 mg | Freq: Every day | INTRAVENOUS | Status: DC
  Filled 2021-12-26: qty 10

## 2021-12-26 MED ORDER — PHENYLEPHRINE HCL-NACL 20-0.9 MG/250ML-% IV SOLN
INTRAVENOUS | Status: DC | PRN
Start: 2021-12-26 — End: 2021-12-26
  Administered 2021-12-26: 40 ug/min via INTRAVENOUS

## 2021-12-26 MED ORDER — 0.9 % SODIUM CHLORIDE (POUR BTL) OPTIME
TOPICAL | Status: DC | PRN
  Administered 2021-12-26: 1000 mL

## 2021-12-26 MED ORDER — PROPOFOL 10 MG/ML IV BOLUS
INTRAVENOUS | Status: DC | PRN
Start: 2021-12-26 — End: 2021-12-26

## 2021-12-26 MED ORDER — CHLORHEXIDINE GLUCONATE CLOTH 2 % EX PADS
6.0000 | MEDICATED_PAD | Freq: Every day | CUTANEOUS | Status: DC
  Administered 2021-12-26 – 2022-01-01 (×7): 6 via TOPICAL

## 2021-12-26 MED ORDER — PROPOFOL 1000 MG/100ML IV EMUL
INTRAVENOUS | Status: AC
  Filled 2021-12-26: qty 100

## 2021-12-26 MED ORDER — FENTANYL CITRATE (PF) 100 MCG/2ML IJ SOLN
INTRAMUSCULAR | Status: AC
  Administered 2021-12-26: 50 ug
  Filled 2021-12-26: qty 2

## 2021-12-26 MED ORDER — FENTANYL CITRATE (PF) 250 MCG/5ML IJ SOLN
INTRAMUSCULAR | Status: DC | PRN
Start: 2021-12-26 — End: 2021-12-26
  Administered 2021-12-26: 100 ug via INTRAVENOUS
  Administered 2021-12-26 (×2): 50 ug via INTRAVENOUS
  Administered 2021-12-26: 100 ug via INTRAVENOUS
  Administered 2021-12-26 (×4): 50 ug via INTRAVENOUS

## 2021-12-26 MED ORDER — IOHEXOL 300 MG/ML  SOLN
100.0000 mL | Freq: Once | INTRAMUSCULAR | Status: AC | PRN
  Administered 2021-12-26: 100 mL via INTRAVENOUS

## 2021-12-26 MED ORDER — ONDANSETRON HCL 4 MG/2ML IJ SOLN
INTRAMUSCULAR | Status: AC
  Filled 2021-12-26: qty 2

## 2021-12-26 MED ORDER — VANCOMYCIN HCL 500 MG IV SOLR
INTRAVENOUS | Status: DC | PRN
  Administered 2021-12-26: 500 mg via TOPICAL

## 2021-12-26 MED ORDER — ALBUMIN HUMAN 5 % IV SOLN: INTRAVENOUS | Status: DC | PRN

## 2021-12-26 MED ORDER — PROPOFOL 1000 MG/100ML IV EMUL
0.0000 ug/kg/min | INTRAVENOUS | Status: DC
  Administered 2021-12-26 (×3): 40 ug/kg/min via INTRAVENOUS
  Administered 2021-12-27: 20 ug/kg/min via INTRAVENOUS
  Filled 2021-12-26 (×4): qty 100

## 2021-12-26 MED ORDER — DOCUSATE SODIUM 50 MG/5ML PO LIQD
100.0000 mg | Freq: Two times a day (BID) | ORAL | Status: DC
  Administered 2021-12-27: 100 mg
  Filled 2021-12-26: qty 10

## 2021-12-26 MED ORDER — POTASSIUM CHLORIDE 10 MEQ/100ML IV SOLN
10.0000 meq | INTRAVENOUS | Status: AC
  Administered 2021-12-26 (×6): 10 meq via INTRAVENOUS
  Filled 2021-12-26 (×6): qty 100

## 2021-12-26 MED ORDER — POLYETHYLENE GLYCOL 3350 17 G PO PACK
17.0000 g | PACK | Freq: Every day | ORAL | Status: DC
  Administered 2021-12-27: 17 g
  Filled 2021-12-26: qty 1

## 2021-12-26 MED ORDER — CEFAZOLIN SODIUM-DEXTROSE 2-4 GM/100ML-% IV SOLN
2.0000 g | Freq: Once | INTRAVENOUS | Status: AC
  Administered 2021-12-26: 2 g via INTRAVENOUS
  Filled 2021-12-26: qty 100

## 2021-12-26 MED ORDER — CHLORHEXIDINE GLUCONATE 4 % EX LIQD
60.0000 mL | Freq: Once | CUTANEOUS | Status: DC
  Filled 2021-12-26: qty 60

## 2021-12-26 MED ORDER — 0.9 % SODIUM CHLORIDE (POUR BTL) OPTIME
TOPICAL | Status: DC | PRN
  Administered 2021-12-26: 3000 mL

## 2021-12-26 MED ORDER — FENTANYL BOLUS VIA INFUSION
50.0000 ug | INTRAVENOUS | Status: DC | PRN
  Filled 2021-12-26: qty 100

## 2021-12-26 MED ORDER — TETANUS-DIPHTH-ACELL PERTUSSIS 5-2.5-18.5 LF-MCG/0.5 IM SUSY
0.5000 mL | PREFILLED_SYRINGE | Freq: Once | INTRAMUSCULAR | Status: AC
  Administered 2021-12-26: 0.5 mL via INTRAMUSCULAR
  Filled 2021-12-26: qty 0.5

## 2021-12-26 MED ORDER — CHLORHEXIDINE GLUCONATE 0.12% ORAL RINSE (MEDLINE KIT)
15.0000 mL | Freq: Two times a day (BID) | OROMUCOSAL | Status: DC
  Administered 2021-12-26 – 2022-01-06 (×9): 15 mL via OROMUCOSAL

## 2021-12-26 MED ORDER — SODIUM CHLORIDE 0.9 % IV SOLN
INTRAVENOUS | Status: AC | PRN
  Administered 2021-12-26: 100 mL/h via INTRAVENOUS

## 2021-12-26 MED ORDER — FENTANYL CITRATE PF 50 MCG/ML IJ SOSY: 50.0000 ug | PREFILLED_SYRINGE | Freq: Once | INTRAMUSCULAR | Status: DC

## 2021-12-26 MED ORDER — PANTOPRAZOLE 2 MG/ML SUSPENSION
40.0000 mg | Freq: Every day | ORAL | Status: DC
  Administered 2021-12-27: 40 mg
  Filled 2021-12-26: qty 20

## 2021-12-26 MED ORDER — ROCURONIUM BROMIDE 50 MG/5ML IV SOLN
INTRAVENOUS | Status: AC | PRN
  Administered 2021-12-26: 80 mg via INTRAVENOUS

## 2021-12-26 MED ORDER — FENTANYL 2500MCG IN NS 250ML (10MCG/ML) PREMIX INFUSION
50.0000 ug/h | INTRAVENOUS | Status: DC
  Administered 2021-12-26: 200 ug/h via INTRAVENOUS
  Filled 2021-12-26: qty 250

## 2021-12-26 SURGICAL SUPPLY — 55 items
APL PRP STRL LF DISP 70% ISPRP (MISCELLANEOUS) ×2
BIOPATCH RED 1 DISK 7.0 (GAUZE/BANDAGES/DRESSINGS) ×3 IMPLANT
CHLORAPREP W/TINT 26 (MISCELLANEOUS) ×3 IMPLANT
COVER SURGICAL LIGHT HANDLE (MISCELLANEOUS) ×3 IMPLANT
COVER TABLE BACK 60X90 (DRAPES) ×1 IMPLANT
DRAPE HALF SHEET 40X57 (DRAPES) ×1 IMPLANT
DRAPE LAPAROSCOPIC ABDOMINAL (DRAPES) ×3 IMPLANT
DRAPE ORTHO SPLIT 77X108 STRL (DRAPES) ×6
DRAPE SURG ORHT 6 SPLT 77X108 (DRAPES) IMPLANT
DRAPE WARM FLUID 44X44 (DRAPES) ×3 IMPLANT
DRSG AQUACEL AG ADV 3.5X 4 (GAUZE/BANDAGES/DRESSINGS) ×3 IMPLANT
DRSG AQUACEL AG ADV 3.5X 6 (GAUZE/BANDAGES/DRESSINGS) ×1 IMPLANT
DRSG OPSITE POSTOP 4X12 (GAUZE/BANDAGES/DRESSINGS) ×1 IMPLANT
DRSG TEGADERM 4X4.75 (GAUZE/BANDAGES/DRESSINGS) ×2 IMPLANT
ELECT BLADE 6.5 EXT (BLADE) ×1 IMPLANT
ELECT CAUTERY BLADE 6.4 (BLADE) ×3 IMPLANT
ELECT REM PT RETURN 9FT ADLT (ELECTROSURGICAL) ×3
ELECTRODE REM PT RTRN 9FT ADLT (ELECTROSURGICAL) ×2 IMPLANT
EVACUATOR SILICONE 100CC (DRAIN) ×3 IMPLANT
GLOVE BIO SURGEON STRL SZ7 (GLOVE) ×1 IMPLANT
GLOVE BIO SURGEON STRL SZ7.5 (GLOVE) ×3 IMPLANT
GLOVE BIOGEL PI IND STRL 8 (GLOVE) ×2 IMPLANT
GLOVE BIOGEL PI IND STRL 8.5 (GLOVE) IMPLANT
GLOVE BIOGEL PI INDICATOR 8 (GLOVE) ×1
GLOVE BIOGEL PI INDICATOR 8.5 (GLOVE) ×1
GLOVE SURG SS PI 7.5 STRL IVOR (GLOVE) ×1 IMPLANT
GOWN STRL REUS W/ TWL LRG LVL3 (GOWN DISPOSABLE) ×2 IMPLANT
GOWN STRL REUS W/ TWL XL LVL3 (GOWN DISPOSABLE) ×2 IMPLANT
GOWN STRL REUS W/TWL LRG LVL3 (GOWN DISPOSABLE) ×3
GOWN STRL REUS W/TWL XL LVL3 (GOWN DISPOSABLE) ×9
HANDLE SUCTION POOLE (INSTRUMENTS) ×2 IMPLANT
KIT BASIN OR (CUSTOM PROCEDURE TRAY) ×3 IMPLANT
KIT TURNOVER KIT B (KITS) ×3 IMPLANT
LIGASURE IMPACT 36 18CM CVD LR (INSTRUMENTS) ×1 IMPLANT
NS IRRIG 1000ML POUR BTL (IV SOLUTION) ×6 IMPLANT
PACK GENERAL/GYN (CUSTOM PROCEDURE TRAY) ×3 IMPLANT
PAD ARMBOARD 7.5X6 YLW CONV (MISCELLANEOUS) ×3 IMPLANT
SET IRRIG Y TYPE TUR BLADDER L (SET/KITS/TRAYS/PACK) ×1 IMPLANT
SPONGE T-LAP 18X18 ~~LOC~~+RFID (SPONGE) ×2 IMPLANT
STAPLER VISISTAT 35W (STAPLE) ×4 IMPLANT
SUCTION POOLE HANDLE (INSTRUMENTS) ×3
SUT ETHILON 2 0 FS 18 (SUTURE) ×7 IMPLANT
SUT PDS AB 2-0 CT1 27 (SUTURE) ×2 IMPLANT
SUT SILK 2 0 SH CR/8 (SUTURE) ×3 IMPLANT
SUT SILK 2 0 TIES 10X30 (SUTURE) ×3 IMPLANT
SUT SILK 3 0 SH CR/8 (SUTURE) ×3 IMPLANT
SUT SILK 3 0 TIES 10X30 (SUTURE) ×3 IMPLANT
SUT VIC AB 0 CT1 27 (SUTURE) ×6
SUT VIC AB 0 CT1 27XBRD ANBCTR (SUTURE) IMPLANT
SUT VIC AB 2-0 SH 18 (SUTURE) ×1 IMPLANT
SUT VIC AB 2-0 SH 27 (SUTURE) ×9
SUT VIC AB 2-0 SH 27X BRD (SUTURE) IMPLANT
TOWEL GREEN STERILE (TOWEL DISPOSABLE) ×4 IMPLANT
TUBE CONNECTING 12X1/4 (SUCTIONS) ×1 IMPLANT
YANKAUER SUCT BULB TIP NO VENT (SUCTIONS) ×1 IMPLANT

## 2021-12-26 NOTE — ED Notes (Signed)
Patient taken to CT with RN, Trauma surgeon and RT. ?
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
## 2021-12-26 NOTE — Anesthesia Procedure Notes (Signed)
Arterial Line Insertion ?Start/End4/29/2023 12:25 PM, 12/26/2021 12:28 PM ?Performed by: Shelton Silvas, MD ? Patient location: Pre-op. ?Preanesthetic checklist: patient identified, IV checked, site marked, risks and benefits discussed, surgical consent, monitors and equipment checked, pre-op evaluation, timeout performed and anesthesia consent ?Lidocaine 1% used for infiltration ?Left, radial was placed ?Catheter size: 20 G ?Hand hygiene performed  and maximum sterile barriers used  ? ?Attempts: 1 ?Procedure performed without using ultrasound guided technique. ?Following insertion, dressing applied and Biopatch. ?Post procedure assessment: normal and unchanged ? ?Patient tolerated the procedure well with no immediate complications. ? ? ?
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
## 2021-12-26 NOTE — Anesthesia Postprocedure Evaluation (Signed)
Anesthesia Post Note ? ?Patient: Sandra Mejia ? ?Procedure(s) Performed: EXPLORATORY LAPAROTOMY; INTRAPERITONEAL DRAIN PLACEMENT (Abdomen) ?IRRIGATION AND DEBRIDEMENT RIGHT AND  LEFT KNEE LACERATON with primary closure (Bilateral: Knee) ? ?  ? ?Patient location during evaluation: SICU ?Anesthesia Type: General ?Level of consciousness: sedated ?Pain management: pain level controlled ?Vital Signs Assessment: post-procedure vital signs reviewed and stable ?Respiratory status: patient remains intubated per anesthesia plan ?Cardiovascular status: stable ?Postop Assessment: no apparent nausea or vomiting ?Anesthetic complications: no ? ? ?No notable events documented. ? ?Last Vitals:  ?Vitals:  ? 12/26/21 0801 12/26/21 0900  ?BP: 91/63 90/70  ?Pulse: 82 79  ?Resp: 18 18  ?Temp: 37.2 ?C 37.2 ?C  ?SpO2: 100% 100%  ?  ?Last Pain:  ?Vitals:  ? 12/26/21 0204  ?TempSrc: Temporal  ?PainSc: 10-Worst pain ever  ? ? ?  ?  ?  ?  ?  ?  ? ?Shelton Silvas ? ? ? ? ?

## 2021-12-26 NOTE — Progress Notes (Signed)
Trauma Event Note ? ?Rounded on patient with Dr Donne Hazel. Plans for exploratory laparotomy with Dr Thermon Leyland and I&D with Dr Kathaleen Bury. Bedside RN Roselyn Reef obtained consent for procedures with MD. Daughter at bedside. ? ? ?Last imported Vital Signs ?BP 90/70   Pulse 79   Temp 99 ?F (37.2 ?C)   Resp 18   Ht 5\' 7"  (1.702 m)   Wt 81.2 kg   SpO2 100%   BMI 28.04 kg/m?  ? ?Trending CBC ?Recent Labs  ?  12/26/21 ?0205 12/26/21 ?0212 12/26/21 ?0350 12/26/21 ?0752 12/26/21 ?0919  ?WBC 13.3*  --   --  17.6* 15.8*  ?HGB 10.8*   < > 10.9* 9.5* 9.7*  ?HCT 35.9*   < > 32.0* 33.2* 33.3*  ?PLT 309  --   --  240 224  ? < > = values in this interval not displayed.  ? ?Trending Coag's ?Recent Labs  ?  12/26/21 ?0205  ?INR 1.1  ? ?Trending BMET ?Recent Labs  ?  12/26/21 ?0205 12/26/21 ?0212 12/26/21 ?0350 12/26/21 ?0752 12/26/21 ?0919  ?NA 137 140 139 139 138  ?K 2.9* 2.9* 2.7* 5.0 5.2*  ?CL 110 107  --  112* 113*  ?CO2 19*  --   --  16* 13*  ?BUN 14 15  --  14 13  ?CREATININE 1.21* 1.20*  --  1.09* 1.04*  ?GLUCOSE 183* 186*  --  129* 85  ? ? ?Park Pope Kupono Marling  ?Trauma Response RN ? ?Please call TRN at [phone] for further assistance. ? ? ?  ?
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
## 2021-12-26 NOTE — ED Notes (Signed)
Pt to be transfer to the unit per [facility], CN. ?
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
## 2021-12-26 NOTE — Progress Notes (Signed)
Critical ABG value given to RN. 

## 2021-12-26 NOTE — Transfer of Care (Signed)
Immediate Anesthesia Transfer of Care Note ? ?Patient: Sandra Mejia ? ?Procedure(s) Performed: EXPLORATORY LAPAROTOMY; INTRAPERITONEAL DRAIN PLACEMENT (Abdomen) ?IRRIGATION AND DEBRIDEMENT RIGHT AND  LEFT KNEE LACERATON with primary closure (Bilateral: Knee) ? ?Patient Location: SICU ? ?Anesthesia Type:General ? ?Level of Consciousness: sedated and Patient remains intubated per anesthesia plan ? ?Airway & Oxygen Therapy: Patient remains intubated per anesthesia plan and Patient placed on Ventilator (see vital sign flow sheet for setting) ? ?Post-op Assessment: Report given to RN and Post -op Vital signs reviewed and stable ? ?Post vital signs: Reviewed and stable ? ?Last Vitals:  ?Vitals Value Taken Time  ?BP 133/74 12/26/21 1411  ?Temp 35.1 ?C 12/26/21 1415  ?Pulse    ?Resp 18 12/26/21 1415  ?SpO2    ?Vitals shown include unvalidated device data. ? ?Last Pain:  ?Vitals:  ? 12/26/21 0204  ?TempSrc: Temporal  ?PainSc: 10-Worst pain ever  ?   ? ?  ? ?Complications: No notable events documented. ?
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
## 2021-12-26 NOTE — Progress Notes (Signed)
Chaplain followed up to introduce family to spiritual care services. Two family members were bedside.  Family denied needs at this time.  Please contact if support is requested or would be helpful.   ? ?Minus Liberty, Chaplain ?Pager: [phone] ? ? ? 12/26/21 1600  ?Clinical Encounter Type  ?Visited With Patient and family together  ?Visit Type Initial;Critical Care  ?Referral From Chaplain  ?Stress Factors  ?Patient Stress Factors Major life changes  ?Family Stress Factors Loss of control  ? ? ?

## 2021-12-26 NOTE — ED Notes (Signed)
Patient stated that she needed to vomit.  ?
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
## 2021-12-26 NOTE — ED Notes (Signed)
Family at bedside. 

## 2021-12-26 NOTE — ED Notes (Signed)
Patient returned from CT
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
## 2021-12-26 NOTE — TOC CAGE-AID Note (Signed)
Transition of Care (TOC) - CAGE-AID Screening ? ? ?Patient Details  ?Name: Sandra Mejia ?MRN: [id number] ?Date of Birth: [birth year] ? ?Transition of Care (TOC) CM/SW Contact:    ?Vanessa Barbara, RN ?Phone Number: ?12/26/2021, 10:11 AM ? ? ?Clinical Narrative: ? ?Patient intubated and sedated at this time, unable to participate in screening. Plans for exploratory laparotomy and I&D by ortho today. Can re-evaluate at a later time. ? ?CAGE-AID Screening: ?Substance Abuse Screening unable to be completed due to: : Patient unable to participate ? ?  ?  ?  ?  ?  ? ?  ? ?  ? ? ? ? ? ? ?

## 2021-12-26 NOTE — Anesthesia Preprocedure Evaluation (Addendum)
Anesthesia Evaluation  ?Patient identified by MRN, date of birth, ID band ?Patient unresponsive ? ? ? ?Reviewed: ?Allergy & Precautions, Patient's Chart, lab work & pertinent test results, Unable to perform ROS - Chart review only ? ?Airway ?Mallampati: Intubated ? ? ? ? ? ? Dental ?  ?Pulmonary ? ?  ?breath sounds clear to auscultation ? ? ? ? ? ? Cardiovascular ? ?Rhythm:Regular Rate:Tachycardia ? ? ?  ?Neuro/Psych ?  ? GI/Hepatic ?  ?Endo/Other  ? ? Renal/GU ?  ? ?  ?Musculoskeletal ? ? Abdominal ?  ?Peds ? Hematology ?  ?Anesthesia Other Findings ? ? Reproductive/Obstetrics ? ?  ? ? ? ? ? ? ? ? ? ? ? ? ? ?  ?  ? ? ? ? ? ? ? ?Anesthesia Physical ?Anesthesia Plan ? ?ASA: 4 ? ?Anesthesia Plan: General  ? ?Post-op Pain Management:   ? ?Induction: Intravenous ? ?PONV Risk Score and Plan: Ondansetron and Midazolam ? ?Airway Management Planned: Oral ETT ? ?Additional Equipment: Arterial line ? ?Intra-op Plan:  ? ?Post-operative Plan: Post-operative intubation/ventilation ? ?Informed Consent:  ? ? ? ?History available from chart only and Only emergency history available ? ?Plan Discussed with: CRNA ? ?Anesthesia Plan Comments:   ? ? ? ? ? ?Anesthesia Quick Evaluation ? ?
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
## 2021-12-27 ENCOUNTER — Inpatient Hospital Stay (HOSPITAL_COMMUNITY): Payer: 59

## 2021-12-27 LAB — CBC
HCT: 22.9 % — ABNORMAL LOW (ref 36.0–46.0)
HCT: 22.5 % — ABNORMAL LOW (ref 36.0–46.0)
HCT: 23.5 % — ABNORMAL LOW (ref 36.0–46.0)
Hemoglobin: 7.1 g/dL — ABNORMAL LOW (ref 12.0–15.0)
Hemoglobin: 7 g/dL — ABNORMAL LOW (ref 12.0–15.0)
Hemoglobin: 7.1 g/dL — ABNORMAL LOW (ref 12.0–15.0)
MCH: 23.3 pg — ABNORMAL LOW (ref 26.0–34.0)
MCH: 23.4 pg — ABNORMAL LOW (ref 26.0–34.0)
MCH: 23.3 pg — ABNORMAL LOW (ref 26.0–34.0)
MCHC: 31 g/dL (ref 30.0–36.0)
MCHC: 31.1 g/dL (ref 30.0–36.0)
MCHC: 30.2 g/dL (ref 30.0–36.0)
MCV: 75.1 fL — ABNORMAL LOW (ref 80.0–100.0)
MCV: 75.3 fL — ABNORMAL LOW (ref 80.0–100.0)
MCV: 77 fL — ABNORMAL LOW (ref 80.0–100.0)
Platelets: 167 10*3/uL (ref 150–400)
Platelets: 159 10*3/uL (ref 150–400)
Platelets: 165 10*3/uL (ref 150–400)
RBC: 3.05 MIL/uL — ABNORMAL LOW (ref 3.87–5.11)
RBC: 2.99 MIL/uL — ABNORMAL LOW (ref 3.87–5.11)
RBC: 3.05 MIL/uL — ABNORMAL LOW (ref 3.87–5.11)
RDW: 20.3 % — ABNORMAL HIGH (ref 11.5–15.5)
RDW: 20.4 % — ABNORMAL HIGH (ref 11.5–15.5)
RDW: 20.3 % — ABNORMAL HIGH (ref 11.5–15.5)
WBC: 6.3 10*3/uL (ref 4.0–10.5)
WBC: 6.3 10*3/uL (ref 4.0–10.5)
WBC: 11.2 10*3/uL — ABNORMAL HIGH (ref 4.0–10.5)
nRBC: 0 % (ref 0.0–0.2)
nRBC: 0 % (ref 0.0–0.2)
nRBC: 0 % (ref 0.0–0.2)

## 2021-12-27 LAB — TRIGLYCERIDES: Triglycerides: 155 mg/dL — ABNORMAL HIGH (ref <150)

## 2021-12-27 MED ORDER — DOCUSATE SODIUM 100 MG PO CAPS
100.0000 mg | ORAL_CAPSULE | Freq: Two times a day (BID) | ORAL | Status: DC
  Administered 2021-12-27 – 2022-01-06 (×12): 100 mg via ORAL
  Filled 2021-12-27 (×17): qty 1

## 2021-12-27 MED ORDER — SIMETHICONE 80 MG PO CHEW
80.0000 mg | CHEWABLE_TABLET | Freq: Four times a day (QID) | ORAL | Status: DC | PRN
  Administered 2022-01-02 – 2022-01-03 (×4): 80 mg via ORAL
  Filled 2021-12-27 (×5): qty 1

## 2021-12-27 MED ORDER — PROCHLORPERAZINE EDISYLATE 10 MG/2ML IJ SOLN
10.0000 mg | INTRAMUSCULAR | Status: DC | PRN
Start: 2021-12-27 — End: 2021-12-28

## 2021-12-27 MED ORDER — ACETAMINOPHEN 325 MG PO TABS
650.0000 mg | ORAL_TABLET | Freq: Four times a day (QID) | ORAL | Status: DC
  Administered 2021-12-27 – 2021-12-28 (×2): 650 mg via ORAL
  Filled 2021-12-27 (×3): qty 2

## 2021-12-27 MED ORDER — OXYCODONE HCL 5 MG PO TABS
5.0000 mg | ORAL_TABLET | ORAL | Status: DC | PRN
Start: 2021-12-27 — End: 2022-01-06
  Administered 2021-12-29 – 2022-01-05 (×4): 5 mg via ORAL
  Filled 2021-12-27 (×5): qty 1

## 2021-12-27 MED ORDER — OXYCODONE HCL 5 MG PO TABS
10.0000 mg | ORAL_TABLET | ORAL | Status: DC | PRN
  Administered 2021-12-27 – 2021-12-31 (×6): 10 mg via ORAL
  Filled 2021-12-27 (×7): qty 2

## 2021-12-27 MED ORDER — GABAPENTIN 300 MG PO CAPS
300.0000 mg | ORAL_CAPSULE | Freq: Three times a day (TID) | ORAL | Status: DC
Start: 2021-12-27 — End: 2021-12-30
  Administered 2021-12-27 – 2021-12-29 (×7): 300 mg via ORAL
  Filled 2021-12-27 (×8): qty 1

## 2021-12-27 MED ORDER — ONDANSETRON HCL 4 MG/2ML IJ SOLN
4.0000 mg | Freq: Four times a day (QID) | INTRAMUSCULAR | Status: DC | PRN
  Administered 2021-12-27 – 2021-12-30 (×3): 4 mg via INTRAVENOUS
  Filled 2021-12-27 (×3): qty 2

## 2021-12-27 MED ORDER — METHOCARBAMOL 1000 MG/10ML IJ SOLN
500.0000 mg | Freq: Four times a day (QID) | INTRAMUSCULAR | Status: DC | PRN
  Filled 2021-12-27 (×2): qty 5

## 2021-12-27 MED ORDER — HYDROMORPHONE HCL 1 MG/ML IJ SOLN
1.0000 mg | INTRAMUSCULAR | Status: DC | PRN
  Administered 2021-12-27 – 2021-12-28 (×4): 1 mg via INTRAVENOUS
  Filled 2021-12-27 (×6): qty 1

## 2021-12-27 NOTE — Procedures (Signed)
Extubation Procedure Note ? ?Patient Details:   ?Name: Sandra Mejia ?DOB: December 22, 1975 ?MRN: [id number] ?  ?Airway Documentation:  ?  ?Vent end date: 12/27/21 Vent end time: 1038  ? ?Evaluation ? O2 sats: stable throughout ?Complications: No apparent complications ?Patient did tolerate procedure well. ?Bilateral Breath Sounds: Clear, Diminished ?  ?Yes ? ?Patient extubated per order to 4L [place] with no complications. Positive cuff leak was noted prior to extubation. Patient is alert and is able to follow commands and weakly speak. Vitals are stable. RT will continue to monitor.  ? ?Bonifacio Pruden Lajuana Ripple ?12/27/2021, 10:42 AM ? ?
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
## 2021-12-27 NOTE — Progress Notes (Signed)
Per Dr Saintclair Halsted may remove C-collar, elevate HOB >30. Plan for extubation today, if after extubation patient experiences pain when moving may need XRs and/or brace. ?

## 2021-12-28 ENCOUNTER — Encounter (HOSPITAL_COMMUNITY): Payer: Self-pay | Admitting: Surgery

## 2021-12-28 ENCOUNTER — Inpatient Hospital Stay (HOSPITAL_COMMUNITY): Payer: 59

## 2021-12-28 LAB — POCT I-STAT 7, (LYTES, BLD GAS, ICA,H+H)
Acid-base deficit: 8 mmol/L — ABNORMAL HIGH (ref 0.0–2.0)
Bicarbonate: 16.7 mmol/L — ABNORMAL LOW (ref 20.0–28.0)
Calcium, Ion: 1.05 mmol/L — ABNORMAL LOW (ref 1.15–1.40)
HCT: 22 % — ABNORMAL LOW (ref 36.0–46.0)
Hemoglobin: 7.5 g/dL — ABNORMAL LOW (ref 12.0–15.0)
O2 Saturation: 100 %
Patient temperature: 37
Potassium: 3.4 mmol/L — ABNORMAL LOW (ref 3.5–5.1)
Sodium: 141 mmol/L (ref 135–145)
TCO2: 18 mmol/L — ABNORMAL LOW (ref 22–32)
pCO2 arterial: 29.9 mmHg — ABNORMAL LOW (ref 32–48)
pH, Arterial: 7.355 (ref 7.35–7.45)
pO2, Arterial: 245 mmHg — ABNORMAL HIGH (ref 83–108)

## 2021-12-28 LAB — PREPARE RBC (CROSSMATCH)

## 2021-12-28 LAB — BASIC METABOLIC PANEL
Anion gap: 6 (ref 5–15)
BUN: 5 mg/dL — ABNORMAL LOW (ref 6–20)
CO2: 19 mmol/L — ABNORMAL LOW (ref 22–32)
Calcium: 7.3 mg/dL — ABNORMAL LOW (ref 8.9–10.3)
Chloride: 109 mmol/L (ref 98–111)
Creatinine, Ser: 0.7 mg/dL (ref 0.44–1.00)
GFR, Estimated: >60 mL/min (ref >60)
Glucose, Bld: 113 mg/dL — ABNORMAL HIGH (ref 70–99)
Potassium: 3.8 mmol/L (ref 3.5–5.1)
Sodium: 134 mmol/L — ABNORMAL LOW (ref 135–145)

## 2021-12-28 LAB — LIPASE, BLOOD: Lipase: 37 U/L (ref 11–51)

## 2021-12-28 LAB — CBC
HCT: 22.7 % — ABNORMAL LOW (ref 36.0–46.0)
Hemoglobin: 7 g/dL — ABNORMAL LOW (ref 12.0–15.0)
MCH: 23.7 pg — ABNORMAL LOW (ref 26.0–34.0)
MCHC: 30.8 g/dL (ref 30.0–36.0)
MCV: 76.9 fL — ABNORMAL LOW (ref 80.0–100.0)
Platelets: 172 10*3/uL (ref 150–400)
RBC: 2.95 MIL/uL — ABNORMAL LOW (ref 3.87–5.11)
RDW: 20.2 % — ABNORMAL HIGH (ref 11.5–15.5)
WBC: 12 10*3/uL — ABNORMAL HIGH (ref 4.0–10.5)
nRBC: 0 % (ref 0.0–0.2)

## 2021-12-28 MED ORDER — ACETAMINOPHEN 500 MG PO TABS
1000.0000 mg | ORAL_TABLET | Freq: Four times a day (QID) | ORAL | Status: DC
  Administered 2021-12-28 – 2022-01-01 (×8): 1000 mg via ORAL
  Filled 2021-12-28 (×11): qty 2

## 2021-12-28 MED ORDER — GADOBUTROL 1 MMOL/ML IV SOLN
10.0000 mL | Freq: Once | INTRAVENOUS | Status: AC | PRN
  Administered 2021-12-28: 10 mL via INTRAVENOUS

## 2021-12-28 MED ORDER — SODIUM CHLORIDE 0.9% IV SOLUTION: Freq: Once | INTRAVENOUS | Status: DC

## 2021-12-28 MED ORDER — KETOROLAC TROMETHAMINE 15 MG/ML IJ SOLN
30.0000 mg | Freq: Four times a day (QID) | INTRAMUSCULAR | Status: AC
Start: 2021-12-28 — End: 2022-01-02
  Administered 2021-12-28 – 2022-01-02 (×13): 30 mg via INTRAVENOUS
  Filled 2021-12-28 (×16): qty 2

## 2021-12-28 MED ORDER — LACTATED RINGERS IV SOLN: INTRAVENOUS | Status: DC

## 2021-12-28 MED ORDER — METHOCARBAMOL 500 MG PO TABS
1000.0000 mg | ORAL_TABLET | Freq: Three times a day (TID) | ORAL | Status: DC
Start: 2021-12-28 — End: 2022-01-05
  Administered 2021-12-28 – 2022-01-04 (×17): 1000 mg via ORAL
  Filled 2021-12-28 (×19): qty 2

## 2021-12-28 MED ORDER — DEXMEDETOMIDINE HCL IN NACL 400 MCG/100ML IV SOLN
0.4000 ug/kg/h | INTRAVENOUS | Status: DC
  Administered 2021-12-28: 0.4 ug/kg/h via INTRAVENOUS
  Filled 2021-12-28: qty 100

## 2021-12-28 MED ORDER — POTASSIUM CHLORIDE CRYS ER 20 MEQ PO TBCR
40.0000 meq | EXTENDED_RELEASE_TABLET | Freq: Once | ORAL | Status: AC
  Administered 2021-12-28: 40 meq via ORAL
  Filled 2021-12-28: qty 2

## 2021-12-28 MED ORDER — DEXMEDETOMIDINE HCL IN NACL 400 MCG/100ML IV SOLN
INTRAVENOUS | Status: AC
  Filled 2021-12-28: qty 100

## 2021-12-28 NOTE — Progress Notes (Signed)
   Inpatient Rehab Admissions Coordinator :  Per therapy recommendations patient was screened for CIR candidacy by Ramanda Paules RN MSN. Patient is not yet at a level to tolerate the intensity required to pursue a CIR admit . Patient may have the potential to progress to become a candidate. The CIR admissions team will follow and monitor for progress and place a Rehab Consult order if felt to be appropriate. Please contact me with any questions.  Anders Hohmann RN MSN Admissions Coordinator [phone]  

## 2021-12-28 NOTE — Progress Notes (Signed)
Precedex d/c'd upon completion of  MRI ?

## 2021-12-28 NOTE — Progress Notes (Signed)
?  Transition of Care (TOC) Screening Note ? ? ?Patient Details  ?Name: Sandra Mejia ?Date of Birth: [birth year] ? ? ?Transition of Care [facility]) CM/SW Contact:    ?Baldemar Lenis, LCSW ?Phone Number: ?12/28/2021, 3:10 PM ? ? ? ?Transition of Care Department [facility]) has reviewed patient and no TOC needs have been identified at this time; medical workup ongoing. We will continue to monitor patient advancement through interdisciplinary progression rounds. If new patient transition needs arise, please place a TOC consult. ?  ?

## 2021-12-28 NOTE — Progress Notes (Signed)
No answer at MRI

## 2021-12-28 NOTE — Progress Notes (Signed)
Orthopedic Tech Progress Note ?Patient Details:  ?Sandra Mejia ?06/28/1976 ?[id number] ? ?Ortho Devices ?Type of Ortho Device: Lumbar corsett ?Ortho Device/Splint Location: BACK ?Ortho Device/Splint Interventions: Ordered ?  ?Post Interventions ?Patient Tolerated: Well ?Instructions Provided: Care of device ? ?Janit Pagan ?12/28/2021, 11:45 AM ? ?
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
## 2021-12-28 NOTE — Progress Notes (Signed)
MRI appointment was for 1300. I called to check status. No answer, will try again. ?

## 2021-12-28 NOTE — Progress Notes (Signed)
Im MRI with pt, awaiting scan to begin ?

## 2021-12-28 NOTE — Progress Notes (Addendum)
MRI dispatching transport now. MD notified of delay in scan. ?
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
## 2021-12-29 LAB — CBC
HCT: 26.4 % — ABNORMAL LOW (ref 36.0–46.0)
Hemoglobin: 8.3 g/dL — ABNORMAL LOW (ref 12.0–15.0)
MCH: 24.1 pg — ABNORMAL LOW (ref 26.0–34.0)
MCHC: 31.4 g/dL (ref 30.0–36.0)
MCV: 76.5 fL — ABNORMAL LOW (ref 80.0–100.0)
Platelets: 220 10*3/uL (ref 150–400)
RBC: 3.45 MIL/uL — ABNORMAL LOW (ref 3.87–5.11)
RDW: 19.2 % — ABNORMAL HIGH (ref 11.5–15.5)
WBC: 12.6 10*3/uL — ABNORMAL HIGH (ref 4.0–10.5)
nRBC: 0 % (ref 0.0–0.2)

## 2021-12-29 LAB — BASIC METABOLIC PANEL
Anion gap: 5 (ref 5–15)
BUN: 6 mg/dL (ref 6–20)
CO2: 20 mmol/L — ABNORMAL LOW (ref 22–32)
Calcium: 8.3 mg/dL — ABNORMAL LOW (ref 8.9–10.3)
Chloride: 112 mmol/L — ABNORMAL HIGH (ref 98–111)
Creatinine, Ser: 0.79 mg/dL (ref 0.44–1.00)
GFR, Estimated: >60 mL/min (ref >60)
Glucose, Bld: 124 mg/dL — ABNORMAL HIGH (ref 70–99)
Potassium: 3.7 mmol/L (ref 3.5–5.1)
Sodium: 137 mmol/L (ref 135–145)

## 2021-12-29 MED ORDER — SENNA 8.6 MG PO TABS
2.0000 | ORAL_TABLET | Freq: Every day | ORAL | Status: DC
  Administered 2021-12-29 – 2022-01-06 (×5): 17.2 mg via ORAL
  Filled 2021-12-29 (×9): qty 2

## 2021-12-29 MED ORDER — POLYETHYLENE GLYCOL 3350 17 G PO PACK
17.0000 g | PACK | Freq: Two times a day (BID) | ORAL | Status: DC
  Administered 2021-12-29 – 2022-01-03 (×7): 17 g via ORAL
  Filled 2021-12-29 (×11): qty 1

## 2021-12-29 MED ORDER — POTASSIUM CHLORIDE CRYS ER 20 MEQ PO TBCR: 40.0000 meq | EXTENDED_RELEASE_TABLET | ORAL | Status: DC

## 2021-12-29 MED ORDER — TAMSULOSIN HCL 0.4 MG PO CAPS
0.4000 mg | ORAL_CAPSULE | Freq: Every day | ORAL | Status: DC
  Administered 2021-12-29 – 2022-01-06 (×7): 0.4 mg via ORAL
  Filled 2021-12-29 (×9): qty 1

## 2021-12-29 MED ORDER — BISACODYL 5 MG PO TBEC
10.0000 mg | DELAYED_RELEASE_TABLET | Freq: Once | ORAL | Status: AC
  Administered 2021-12-29: 10 mg via ORAL
  Filled 2021-12-29: qty 2

## 2021-12-29 MED ORDER — POTASSIUM CHLORIDE CRYS ER 20 MEQ PO TBCR
20.0000 meq | EXTENDED_RELEASE_TABLET | ORAL | Status: AC
  Administered 2021-12-29 (×3): 20 meq via ORAL
  Filled 2021-12-29 (×3): qty 1

## 2021-12-29 MED ORDER — BISACODYL 10 MG RE SUPP
10.0000 mg | Freq: Once | RECTAL | Status: DC
  Filled 2021-12-29: qty 1
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
## 2021-12-29 NOTE — Progress Notes (Signed)
Inpatient Rehabilitation Admissions Coordinator  ? ?I will place order for rehab consult for full assessment. ? ?Ottie Glazier, RN, MSN ?Rehab Admissions Coordinator ?[phone] ?12/29/2021 12:58 PM ? ?
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
## 2021-12-29 NOTE — Procedures (Signed)
Inpatient Rehabilitation Admissions Coordinator   ? ?I met with patient at bedside with her Aunt . We discussed goals and expectations of a possible Cir admit. They prefer Cir before returning home with her adult daughter. I will begin insurance Auth with [place]. ? ?Danne Baxter, RN, MSN ?Rehab Admissions Coordinator ?[phone] ?12/29/2021 2:54 PM ? ?

## 2021-12-30 ENCOUNTER — Inpatient Hospital Stay (HOSPITAL_COMMUNITY): Payer: 59

## 2021-12-30 LAB — BPAM RBC
Blood Product Expiration Date: 202305302359
Blood Product Expiration Date: 202305302359
Blood Product Expiration Date: 202305302359
Blood Product Expiration Date: 202305302359
ISSUE DATE / TIME: 202304292046
ISSUE DATE / TIME: 202304292046
ISSUE DATE / TIME: 202305010909
Unit Type and Rh: 5100
Unit Type and Rh: 5100
Unit Type and Rh: 5100
Unit Type and Rh: 5100

## 2021-12-30 LAB — TYPE AND SCREEN
ABO/RH(D): O POS
Antibody Screen: NEGATIVE
Unit division: 0
Unit division: 0
Unit division: 0
Unit division: 0

## 2021-12-30 LAB — CBC
HCT: 24.1 % — ABNORMAL LOW (ref 36.0–46.0)
Hemoglobin: 7.6 g/dL — ABNORMAL LOW (ref 12.0–15.0)
MCH: 23.8 pg — ABNORMAL LOW (ref 26.0–34.0)
MCHC: 31.5 g/dL (ref 30.0–36.0)
MCV: 75.3 fL — ABNORMAL LOW (ref 80.0–100.0)
Platelets: 276 10*3/uL (ref 150–400)
RBC: 3.2 MIL/uL — ABNORMAL LOW (ref 3.87–5.11)
RDW: 19.2 % — ABNORMAL HIGH (ref 11.5–15.5)
WBC: 12.9 10*3/uL — ABNORMAL HIGH (ref 4.0–10.5)
nRBC: 0.4 % — ABNORMAL HIGH (ref 0.0–0.2)

## 2021-12-30 MED ORDER — GABAPENTIN 100 MG PO CAPS: 100.0000 mg | ORAL_CAPSULE | Freq: Three times a day (TID) | ORAL | Status: DC | PRN

## 2021-12-30 MED ORDER — SORBITOL 70 % SOLN
960.0000 mL | TOPICAL_OIL | Freq: Once | ORAL | Status: AC
  Administered 2021-12-30: 960 mL via RECTAL
  Filled 2021-12-30: qty 473

## 2021-12-30 MED ORDER — SODIUM CHLORIDE 0.9 % IV SOLN: 25.0000 mg | Freq: Four times a day (QID) | INTRAVENOUS | Status: DC | PRN

## 2021-12-30 MED ORDER — SODIUM CHLORIDE 0.9 % IV SOLN: 12.5000 mg | Freq: Four times a day (QID) | INTRAVENOUS | Status: DC | PRN

## 2021-12-30 MED ORDER — SODIUM CHLORIDE 0.9 % IV SOLN
25.0000 mg | Freq: Four times a day (QID) | INTRAVENOUS | Status: DC | PRN
  Administered 2021-12-30 – 2022-01-03 (×3): 25 mg via INTRAVENOUS
  Filled 2021-12-30 (×4): qty 1

## 2021-12-30 MED ORDER — PROMETHAZINE HCL 25 MG/ML IJ SOLN: 25.0000 mg | Freq: Four times a day (QID) | INTRAMUSCULAR | Status: DC | PRN

## 2021-12-30 MED ORDER — SODIUM CHLORIDE 0.9 % IV SOLN
12.5000 mg | Freq: Four times a day (QID) | INTRAVENOUS | Status: DC | PRN
  Filled 2021-12-30: qty 0.5

## 2021-12-30 MED ORDER — PROCHLORPERAZINE EDISYLATE 10 MG/2ML IJ SOLN
10.0000 mg | Freq: Once | INTRAMUSCULAR | Status: AC
  Administered 2021-12-31: 10 mg via INTRAVENOUS
  Filled 2021-12-30: qty 2

## 2021-12-30 MED ORDER — PROMETHAZINE HCL 25 MG/ML IJ SOLN: 12.5000 mg | Freq: Four times a day (QID) | INTRAMUSCULAR | Status: DC | PRN

## 2021-12-30 MED ORDER — HYDROMORPHONE HCL 1 MG/ML IJ SOLN
0.5000 mg | INTRAMUSCULAR | Status: DC | PRN
  Administered 2022-01-01 – 2022-01-02 (×2): 0.5 mg via INTRAVENOUS
  Filled 2021-12-30 (×2): qty 0.5

## 2021-12-30 NOTE — Progress Notes (Addendum)
Pt reporting persistent nausea. Zofran given earlier this morning with little to no relief, per pt report. PA made aware of continued nausea and emesis x1 this AM. ? ?Phenergan IVPB available at this time but pt declines saying "I don't want to take any more medicine until I eat something." ? ?Education given to both pt and sister at bedside regarding use of phenergan as prophylactic treatment for nausea prior to eating. Pt continues to decline. Pt also reporting 9/10 back pain but declining scheduled toradol at this time. ?
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
## 2021-12-30 NOTE — Progress Notes (Signed)
Inpatient Rehabilitation Admissions Coordinator  ? ?I met with patient , sister and daughter at bedside. I await medical readiness to admit to CIR for I have insurance approval. I reviewed the need for bowel regimen to assist with her nausea and why she was unable to eat solid food at this time. Sister states she has history of bowel issues prior to admit. I will follow. ? ?Danne Baxter, RN, MSN ?Rehab Admissions Coordinator ?[phone] ?12/30/2021 12:29 PM ? ?

## 2021-12-30 NOTE — Progress Notes (Signed)
Subjective: ?4 Days Post-Op Procedure(s) (LRB): ?EXPLORATORY LAPAROTOMY; INTRAPERITONEAL DRAIN PLACEMENT (N/A) ?IRRIGATION AND DEBRIDEMENT RIGHT AND  LEFT KNEE LACERATON with primary closure (Bilateral) ? ?Patient reports no pain in either knee. She has been feeling better now recovering from her procedures especially her abdominal surgery. She denies numbness/tingling.  ? ?Objective:  ? ?VITALS:  Temp:  [97.8 ?F (36.6 ?C)-99.5 ?F (37.5 ?C)] 98.6 ?F (37 ?C) (05/03 0743) ?Pulse Rate:  [90-107] 90 (05/03 0743) ?Resp:  [18] 18 (05/03 0743) ?BP: (134-140)/(76-85) 140/77 (05/03 0743) ?SpO2:  [96 %-100 %] 98 % (05/03 0743) ? ?Gen: AAOx3, NAD ?Comfortable at rest ? ?Bilateral Lower Extremity: ?Skin intact, dressings c/d/i ?No TTP  ?HF/KE/KF/ADF/APF/EHL 5/5 ?SILT throughout ?DP, PT 2+ to palp ?CR < 2s ? ? ? ?LABS ?Recent Labs  ?  12/27/21 ?1737 12/28/21 ?0409 12/29/21 ?0859 12/30/21 ?0307  ?HGB 7.1* 7.0* 8.3* 7.6*  ?WBC 11.2* 12.0* 12.6* 12.9*  ?PLT 165 172 220 276  ? ?Recent Labs  ?  12/28/21 ?0409 12/29/21 ?0859  ?NA 134* 137  ?K 3.8 3.7  ?CL 109 112*  ?CO2 19* 20*  ?BUN 5* 6  ?CREATININE 0.70 0.79  ?GLUCOSE 113* 124*  ? ?No results for input(s): LABPT, INR in the last 72 hours. ? ? ?Assessment/Plan: ?4 Days Post-Op Procedure(s) (LRB): ?EXPLORATORY LAPAROTOMY; INTRAPERITONEAL DRAIN PLACEMENT (N/A) ?IRRIGATION AND DEBRIDEMENT RIGHT AND  LEFT KNEE LACERATON with primary closure (Bilateral) ? ?-WBAT BLE, avoid knee flexion past 90 degrees until incisions are healed ?-maintain Aquacel dressing x 1 wk and then do daily dry dressings  ?-VTE ppx and abx per primary trauma team ?-follow up in 7-10 days for wound check (patient to call [phone] to schedule) ?-sutures out in 2-3 weeks in outpatient office if discharged prior to that time ? ?Netta Cedars ?12/30/2021, 2:03 PM ? ?

## 2021-12-30 NOTE — Progress Notes (Signed)
One occurrence of clear/green emesis; medium amount. ?

## 2021-12-30 NOTE — Discharge Instructions (Addendum)
Follow Up with Orthopaedic Surgery ?Netta Cedars MD - [place] ?Call my office at [phone] when you are discharged from the hospital or surgery center to schedule an appointment to be seen within a week from discharge. ?Call my office at [phone] if you develop a fever >101.5? F, nausea, vomiting, bleeding from the surgical site or severe pain.   ? ?[facility]      [facility], [place] ?[phone] ? ?OPEN ABDOMINAL SURGERY: POST OP INSTRUCTIONS ? ?Always review your discharge instruction sheet given to you by the facility where your surgery was performed. ? ?IF YOU HAVE DISABILITY OR FAMILY LEAVE FORMS, YOU MUST BRING THEM TO THE OFFICE FOR PROCESSING.  PLEASE DO NOT GIVE THEM TO YOUR DOCTOR. ? ?A prescription for pain medication may be given to you upon discharge.  Take your pain medication as prescribed, if needed.  If narcotic pain medicine is not needed, then you may take acetaminophen (Tylenol) or ibuprofen (Advil) as needed. ?Take your usually prescribed medications unless otherwise directed. ?If you need a refill on your pain medication, please contact your pharmacy. They will contact our office to request authorization.  Prescriptions will not be filled after 5pm or on week-ends. ?You should follow a light diet the first few days after arrival home, such as soup and crackers, pudding, etc.unless your doctor has advised otherwise. A high-fiber, low fat diet can be resumed as tolerated.   Be sure to include lots of fluids daily. Most patients will experience some swelling and bruising on the chest and neck area.  Ice packs will help.  Swelling and bruising can take several days to resolve ?Most patients will experience some swelling and bruising in the area of the incision. Ice pack will help. Swelling and bruising can take several days to resolve.Marland Kitchen  ?It is common to experience some constipation if taking pain medication after surgery.  Increasing fluid intake and taking a stool softener  will usually help or prevent this problem from occurring.  A mild laxative (Milk of Magnesia or Miralax) should be taken according to package directions if there are no bowel movements after 48 hours. ? You may have steri-strips (small skin tapes) in place directly over the incision.  These strips should be left on the skin for 7-10 days.  If your surgeon used skin glue on the incision, you may shower in 24 hours.  The glue will flake off over the next 2-3 weeks.  Any sutures or staples will be removed at the office during your follow-up visit. You may find that a light gauze bandage over your incision may keep your staples from being rubbed or pulled. You may shower and replace the bandage daily. ?ACTIVITIES:  You may resume regular (light) daily activities beginning the next day--such as daily self-care, walking, climbing stairs--gradually increasing activities as tolerated.  You may have sexual intercourse when it is comfortable.  Refrain from any heavy lifting or straining until approved by your doctor. ?You may drive when you no longer are taking prescription pain medication, you can comfortably wear a seatbelt, and you can safely maneuver your car and apply brakes ?You should see your doctor in the office for a follow-up appointment approximately two weeks after your surgery.  Make sure that you call for this appointment within a day or two after you arrive home to insure a convenient appointment time. ? ? ?WHEN TO CALL YOUR DOCTOR: ?Fever over 101.0 ?Inability to urinate ?Nausea and/or vomiting ?Extreme swelling or bruising ?Continued bleeding from  incision. ?Increased pain, redness, or drainage from the incision. ?Difficulty swallowing or breathing ?Muscle cramping or spasms. ?Numbness or tingling in hands or feet or around lips. ? ?The clinic staff is available to answer your questions during regular business hours.  Please don?t hesitate to call and ask to speak to one of the nurses if you have  concerns. ? ?For further questions, please visit [email or web address] ? ? ?

## 2021-12-30 NOTE — Progress Notes (Signed)
PT Cancellation Note ? ?Patient Details ?Name: Sandra Mejia ?MRN: [id number] ?DOB: 05/06/1976 ? ? ?Cancelled Treatment:    Reason Eval/Treat Not Completed: Other (comment), pt with max c/o nausea declining mobility at this time. Will check back/ re-attempt as schedule allows to continue with PT POC. ? ? ? ?[name] ?12/30/2021, 10:27 AM ?

## 2021-12-30 NOTE — Progress Notes (Signed)
Attempted to insert 12 fr NG tube into left nare pt couldn't tolerate entire length. MD notified.  ?

## 2021-12-30 NOTE — Progress Notes (Signed)
Trauma Event Note ? ? ? ?TRN to bedside to evaluate patient for NG tube placement d/t bilious vomiting. Primary nursing team made one attempt already however patient reported she "couldn't take it anymore" halfway through the procedure and requested the tube be withdrawn. Upon my arrival to bedside, pt sitting up in chair, appears pale. States she feels much better after one episode of "a lot" of emesis, denies pain. Explained NG tube, offered PRN hydromorphone prior to procedure to help control pain. Patient refusing tube placement at this time. Educated on purpose of NG, explained that vomiting may continue and she may need NG tube placed later in the day - pt verbally acknowledged however continues to refuse. Alert and appropriate, family at bedside. Patient declined NG tube. ? ?Also evaluated IV in R antecubital, per primary RN site has been leaking. No blood return on my assessment, leaking to insertion site when flush attempted. IV removed, catheter intact, dressing applied. 22G IV placed in right wrist, see LDA flowsheets. ? ?Last imported Vital Signs ?BP 137/85 (BP Location: Left Arm)   Pulse 95   Temp 99.1 ?F (37.3 ?C) (Oral)   Resp 18   Ht 5\' 7"  (1.702 m)   Wt 179 lb (81.2 kg)   SpO2 98%   BMI 28.04 kg/m?  ? ?Trending CBC ?Recent Labs  ?  12/28/21 ?0409 12/29/21 ?0859 12/30/21 ?0307  ?WBC 12.0* 12.6* 12.9*  ?HGB 7.0* 8.3* 7.6*  ?HCT 22.7* 26.4* 24.1*  ?PLT 172 220 276  ? ? ? ?Trending BMET ?Recent Labs  ?  12/28/21 ?0409 12/29/21 ?0859  ?NA 134* 137  ?K 3.8 3.7  ?CL 109 112*  ?CO2 19* 20*  ?BUN 5* 6  ?CREATININE 0.70 0.79  ?GLUCOSE 113* 124*  ? ? ? ? ?Kandra Graven O Ciel Yanes  ?Trauma Response RN ? ?Please call TRN at [phone] for further assistance. ? ? ?  ?

## 2021-12-30 NOTE — Progress Notes (Addendum)
Pt able to have two small bowel movements this afternoon post SMOG enema. ? ?She continues to report nausea and pain, however is declining all antiemetics and pain medications with worry that they will worsen her nausea. I assured her that anti-emetics do not worsen N/v but help to prevent it. Pt continues to decline.  ? ?Pt able to tolerate about 50% of dinner tray. ?
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
## 2021-12-30 NOTE — Progress Notes (Addendum)
Pt continues to c/o nausea with active vomiting and refusing nausea meds. In pain but refuses pain meds and all other meds. Refuses ng tube. Notified dr.white via page of pt refusing everything Will cont to monitor  ?

## 2021-12-31 ENCOUNTER — Inpatient Hospital Stay (HOSPITAL_COMMUNITY): Payer: 59

## 2021-12-31 LAB — CBC
HCT: 26.5 % — ABNORMAL LOW (ref 36.0–46.0)
Hemoglobin: 8.1 g/dL — ABNORMAL LOW (ref 12.0–15.0)
MCH: 23.6 pg — ABNORMAL LOW (ref 26.0–34.0)
MCHC: 30.6 g/dL (ref 30.0–36.0)
MCV: 77.3 fL — ABNORMAL LOW (ref 80.0–100.0)
Platelets: 273 10*3/uL (ref 150–400)
RBC: 3.43 MIL/uL — ABNORMAL LOW (ref 3.87–5.11)
RDW: 19.6 % — ABNORMAL HIGH (ref 11.5–15.5)
WBC: 11.5 10*3/uL — ABNORMAL HIGH (ref 4.0–10.5)
nRBC: 0 % (ref 0.0–0.2)

## 2021-12-31 MED ORDER — PROCHLORPERAZINE EDISYLATE 10 MG/2ML IJ SOLN
10.0000 mg | Freq: Once | INTRAMUSCULAR | Status: AC
  Administered 2021-12-31: 10 mg via INTRAVENOUS
  Filled 2021-12-31: qty 2

## 2021-12-31 MED ORDER — LORAZEPAM 2 MG/ML IJ SOLN
1.0000 mg | Freq: Once | INTRAMUSCULAR | Status: AC
  Administered 2021-12-31: 1 mg via INTRAVENOUS
  Filled 2021-12-31: qty 1

## 2021-12-31 MED ORDER — ENOXAPARIN SODIUM 30 MG/0.3ML IJ SOSY
30.0000 mg | PREFILLED_SYRINGE | Freq: Two times a day (BID) | INTRAMUSCULAR | Status: DC
  Administered 2021-12-31 – 2022-01-06 (×13): 30 mg via SUBCUTANEOUS
  Filled 2021-12-31 (×13): qty 0.3

## 2021-12-31 MED ORDER — PHENOL 1.4 % MT LIQD
1.0000 | OROMUCOSAL | Status: DC | PRN
  Administered 2022-01-01: 1 via OROMUCOSAL
  Filled 2021-12-31: qty 177

## 2021-12-31 NOTE — Progress Notes (Signed)
Multiple attempts with 53F NGT unsuccessful. Dr. Grandville Silos notified. ?
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
## 2021-12-31 NOTE — Progress Notes (Signed)
OT Cancellation Note ? ?Patient Details ?Name: Estrellita Mankey ?MRN: [id number] ?DOB: 1976/03/08 ? ? ?Cancelled Treatment:    Reason Eval/Treat Not Completed: Other (comment). Patient medically sedated and awaiting NGT placement. Will hold for now and f/u as able. ? ?Kelli Churn ?12/31/2021, 11:34 AM ?

## 2021-12-31 NOTE — Progress Notes (Signed)
Attempted NGT bilaterally without success. MD notified. Will attempt with smaller NGT. ?

## 2021-12-31 NOTE — Progress Notes (Signed)
?  Inpatient Rehabilitation Admissions Coordinator  ? ?I met at bedside with patient , her Mom and Daughter. She states she is in agreement to tube today. I will follow up as I await medical readiness to admit to Cir. ? ?Danne Baxter, RN, MSN ?Rehab Admissions Coordinator ?([phone] ?12/31/2021 10:10 AM ? ?
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
## 2022-01-01 LAB — BASIC METABOLIC PANEL
Anion gap: 11 (ref 5–15)
BUN: 13 mg/dL (ref 6–20)
CO2: 19 mmol/L — ABNORMAL LOW (ref 22–32)
Calcium: 7.9 mg/dL — ABNORMAL LOW (ref 8.9–10.3)
Chloride: 106 mmol/L (ref 98–111)
Creatinine, Ser: 0.79 mg/dL (ref 0.44–1.00)
GFR, Estimated: >60 mL/min (ref >60)
Glucose, Bld: 89 mg/dL (ref 70–99)
Potassium: 3.4 mmol/L — ABNORMAL LOW (ref 3.5–5.1)
Sodium: 136 mmol/L (ref 135–145)

## 2022-01-01 LAB — CBC
HCT: 24 % — ABNORMAL LOW (ref 36.0–46.0)
Hemoglobin: 7.5 g/dL — ABNORMAL LOW (ref 12.0–15.0)
MCH: 24.5 pg — ABNORMAL LOW (ref 26.0–34.0)
MCHC: 31.3 g/dL (ref 30.0–36.0)
MCV: 78.4 fL — ABNORMAL LOW (ref 80.0–100.0)
Platelets: 288 10*3/uL (ref 150–400)
RBC: 3.06 MIL/uL — ABNORMAL LOW (ref 3.87–5.11)
RDW: 19.9 % — ABNORMAL HIGH (ref 11.5–15.5)
WBC: 12.6 10*3/uL — ABNORMAL HIGH (ref 4.0–10.5)
nRBC: 0.2 % (ref 0.0–0.2)

## 2022-01-01 MED ORDER — ACETAMINOPHEN 160 MG/5ML PO SOLN
1000.0000 mg | Freq: Four times a day (QID) | ORAL | Status: DC
  Administered 2022-01-01 – 2022-01-04 (×9): 1000 mg via ORAL
  Filled 2022-01-01 (×9): qty 40.6
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
## 2022-01-01 NOTE — Plan of Care (Signed)
  Problem: Health Behavior/Discharge Planning: Goal: Ability to manage health-related needs will improve Outcome: Progressing   
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
## 2022-01-01 NOTE — Progress Notes (Signed)
Mobility Specialist Progress Note: ? ? 01/01/22 1334  ?Mobility  ?Activity Ambulated with assistance in room  ?Level of Assistance Modified independent, requires aide device or extra time  ?Assistive Device Front wheel walker  ?RLE Weight Bearing WBAT  ?LLE Weight Bearing WBAT  ?Distance Ambulated (ft) 20 ft  ?Activity Response Tolerated well  ?$Mobility charge 1 Mobility  ? ?Pt received in chair asking to use restroom. Complaints of back pain. Left in chair with call bell in reach and all needs met.  ? ?Sandra Mejia ?Mobility Specialist ?Primary Phone [phone] ? ?

## 2022-01-02 LAB — CBC
HCT: 23.8 % — ABNORMAL LOW (ref 36.0–46.0)
Hemoglobin: 7.4 g/dL — ABNORMAL LOW (ref 12.0–15.0)
MCH: 24.2 pg — ABNORMAL LOW (ref 26.0–34.0)
MCHC: 31.1 g/dL (ref 30.0–36.0)
MCV: 77.8 fL — ABNORMAL LOW (ref 80.0–100.0)
Platelets: 317 10*3/uL (ref 150–400)
RBC: 3.06 MIL/uL — ABNORMAL LOW (ref 3.87–5.11)
RDW: 20 % — ABNORMAL HIGH (ref 11.5–15.5)
WBC: 11.5 10*3/uL — ABNORMAL HIGH (ref 4.0–10.5)
nRBC: 0.2 % (ref 0.0–0.2)
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
## 2022-01-03 LAB — CBC
HCT: 25.6 % — ABNORMAL LOW (ref 36.0–46.0)
Hemoglobin: 7.7 g/dL — ABNORMAL LOW (ref 12.0–15.0)
MCH: 24.2 pg — ABNORMAL LOW (ref 26.0–34.0)
MCHC: 30.1 g/dL (ref 30.0–36.0)
MCV: 80.5 fL (ref 80.0–100.0)
Platelets: 368 10*3/uL (ref 150–400)
RBC: 3.18 MIL/uL — ABNORMAL LOW (ref 3.87–5.11)
RDW: 20.8 % — ABNORMAL HIGH (ref 11.5–15.5)
WBC: 12.3 10*3/uL — ABNORMAL HIGH (ref 4.0–10.5)
nRBC: 0.2 % (ref 0.0–0.2)
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
## 2022-01-04 ENCOUNTER — Inpatient Hospital Stay (HOSPITAL_COMMUNITY): Payer: 59

## 2022-01-04 LAB — CBC
HCT: 27.4 % — ABNORMAL LOW (ref 36.0–46.0)
Hemoglobin: 8.4 g/dL — ABNORMAL LOW (ref 12.0–15.0)
MCH: 24.8 pg — ABNORMAL LOW (ref 26.0–34.0)
MCHC: 30.7 g/dL (ref 30.0–36.0)
MCV: 80.8 fL (ref 80.0–100.0)
Platelets: 492 10*3/uL — ABNORMAL HIGH (ref 150–400)
RBC: 3.39 MIL/uL — ABNORMAL LOW (ref 3.87–5.11)
RDW: 21.8 % — ABNORMAL HIGH (ref 11.5–15.5)
WBC: 13.2 10*3/uL — ABNORMAL HIGH (ref 4.0–10.5)
nRBC: 0.5 % — ABNORMAL HIGH (ref 0.0–0.2)

## 2022-01-04 LAB — BASIC METABOLIC PANEL
Anion gap: 10 (ref 5–15)
BUN: 5 mg/dL — ABNORMAL LOW (ref 6–20)
CO2: 22 mmol/L (ref 22–32)
Calcium: 8.4 mg/dL — ABNORMAL LOW (ref 8.9–10.3)
Chloride: 107 mmol/L (ref 98–111)
Creatinine, Ser: 0.77 mg/dL (ref 0.44–1.00)
GFR, Estimated: >60 mL/min (ref >60)
Glucose, Bld: 97 mg/dL (ref 70–99)
Potassium: 3.1 mmol/L — ABNORMAL LOW (ref 3.5–5.1)
Sodium: 139 mmol/L (ref 135–145)

## 2022-01-04 LAB — MAGNESIUM: Magnesium: 1.7 mg/dL (ref 1.7–2.4)

## 2022-01-04 LAB — PREALBUMIN: Prealbumin: 15.3 mg/dL — ABNORMAL LOW (ref 18–38)

## 2022-01-04 MED ORDER — POTASSIUM CHLORIDE CRYS ER 20 MEQ PO TBCR
40.0000 meq | EXTENDED_RELEASE_TABLET | Freq: Two times a day (BID) | ORAL | Status: AC
  Administered 2022-01-04 – 2022-01-05 (×2): 40 meq via ORAL
  Filled 2022-01-04: qty 2

## 2022-01-04 MED ORDER — IOHEXOL 9 MG/ML PO SOLN
ORAL | Status: AC
  Filled 2022-01-04: qty 1000

## 2022-01-04 MED ORDER — IOHEXOL 300 MG/ML  SOLN
100.0000 mL | Freq: Once | INTRAMUSCULAR | Status: AC | PRN
  Administered 2022-01-04: 100 mL via INTRAVENOUS

## 2022-01-04 MED ORDER — POLYETHYLENE GLYCOL 3350 17 G PO PACK
17.0000 g | PACK | Freq: Every day | ORAL | Status: DC
  Administered 2022-01-05 – 2022-01-06 (×2): 17 g via ORAL
  Filled 2022-01-04 (×3): qty 1

## 2022-01-04 MED ORDER — ENSURE ENLIVE PO LIQD
237.0000 mL | Freq: Two times a day (BID) | ORAL | Status: DC
  Administered 2022-01-04 – 2022-01-05 (×3): 237 mL via ORAL

## 2022-01-04 MED ORDER — ACETAMINOPHEN 500 MG PO TABS
1000.0000 mg | ORAL_TABLET | Freq: Four times a day (QID) | ORAL | Status: DC
  Administered 2022-01-04 – 2022-01-06 (×7): 1000 mg via ORAL
  Filled 2022-01-04 (×7): qty 2

## 2022-01-04 MED ORDER — POTASSIUM CHLORIDE 20 MEQ PO PACK: 40.0000 meq | PACK | ORAL | Status: AC

## 2022-01-04 NOTE — Progress Notes (Signed)
Pt transported to CT ?
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
## 2022-01-04 NOTE — Progress Notes (Signed)
Pt has vomited x 2 during the night.  PRN Phenergan given earlier in the shift was effective until she took morning dose of Tylenol and Robaxin which she vomited after taking. ?Hilton Sinclair BSN RN CMSRN ?01/04/2022, 7:08 AM ? ?

## 2022-01-04 NOTE — Progress Notes (Signed)
Inpatient Rehabilitation Admissions Coordinator  ? ?I met with patient , sister and daughter. Patient mobilizing well, and may mobilize well enough to d/c directly home once medically ready. I will follow. ? ?Danne Baxter, RN, MSN ?Rehab Admissions Coordinator ?[phone] ?01/04/2022 12:31 PM ? ?
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
## 2022-01-05 LAB — AMYLASE, BODY FLUID (OTHER)
Amylase, Body Fluid: 108 U/L
Amylase, Body Fluid: 132 U/L
Amylase, Body Fluid: 67 U/L

## 2022-01-05 LAB — BASIC METABOLIC PANEL
Anion gap: 7 (ref 5–15)
BUN: 5 mg/dL — ABNORMAL LOW (ref 6–20)
CO2: 22 mmol/L (ref 22–32)
Calcium: 8.2 mg/dL — ABNORMAL LOW (ref 8.9–10.3)
Chloride: 108 mmol/L (ref 98–111)
Creatinine, Ser: 0.72 mg/dL (ref 0.44–1.00)
GFR, Estimated: >60 mL/min (ref >60)
Glucose, Bld: 88 mg/dL (ref 70–99)
Potassium: 3.1 mmol/L — ABNORMAL LOW (ref 3.5–5.1)
Sodium: 137 mmol/L (ref 135–145)

## 2022-01-05 LAB — CBC
HCT: 25.8 % — ABNORMAL LOW (ref 36.0–46.0)
Hemoglobin: 7.7 g/dL — ABNORMAL LOW (ref 12.0–15.0)
MCH: 24.4 pg — ABNORMAL LOW (ref 26.0–34.0)
MCHC: 29.8 g/dL — ABNORMAL LOW (ref 30.0–36.0)
MCV: 81.9 fL (ref 80.0–100.0)
Platelets: 476 10*3/uL — ABNORMAL HIGH (ref 150–400)
RBC: 3.15 MIL/uL — ABNORMAL LOW (ref 3.87–5.11)
RDW: 22.4 % — ABNORMAL HIGH (ref 11.5–15.5)
WBC: 13.8 10*3/uL — ABNORMAL HIGH (ref 4.0–10.5)
nRBC: 0.4 % — ABNORMAL HIGH (ref 0.0–0.2)

## 2022-01-05 MED ORDER — CYCLOBENZAPRINE HCL 5 MG PO TABS
5.0000 mg | ORAL_TABLET | Freq: Three times a day (TID) | ORAL | Status: DC
  Administered 2022-01-05 – 2022-01-06 (×4): 5 mg via ORAL
  Filled 2022-01-05 (×4): qty 1

## 2022-01-05 MED ORDER — POTASSIUM CHLORIDE CRYS ER 20 MEQ PO TBCR
40.0000 meq | EXTENDED_RELEASE_TABLET | Freq: Two times a day (BID) | ORAL | Status: AC
  Administered 2022-01-05: 40 meq via ORAL
  Filled 2022-01-05 (×2): qty 2
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
## 2022-01-05 NOTE — Progress Notes (Signed)
Inpatient Rehabilitation Admissions Coordinator  ? ?Continues to progress well with therapy and is min guard with transfers and min guard 230 feet with RW. Likely will progress to discharge directly home with [facility] when medically cleared. ? ?Ottie Glazier, RN, MSN ?Rehab Admissions Coordinator ?[phone] ?01/05/2022 3:25 PM ? ?

## 2022-01-05 NOTE — Progress Notes (Signed)
Mobility Specialist Progress Note: ? ? 01/05/22 1149  ?Mobility  ?Activity Ambulated with assistance in room  ?Level of Assistance Standby assist, set-up cues, supervision of patient - no hands on  ?Assistive Device Front wheel walker  ?Distance Ambulated (ft) 20 ft  ?Activity Response Tolerated well  ?$Mobility charge 1 Mobility  ? ?Pt received in bathroom needing to get back to chair. Left in chair with call bell in reach and all needs met. Will follow-up later for further ambulation.  ? ?Sandra Mejia ?Mobility Specialist ?Primary Phone [phone] ? ?
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
## 2022-01-06 LAB — CBC
HCT: 26.5 % — ABNORMAL LOW (ref 36.0–46.0)
Hemoglobin: 8 g/dL — ABNORMAL LOW (ref 12.0–15.0)
MCH: 24.8 pg — ABNORMAL LOW (ref 26.0–34.0)
MCHC: 30.2 g/dL (ref 30.0–36.0)
MCV: 82 fL (ref 80.0–100.0)
Platelets: 482 10*3/uL — ABNORMAL HIGH (ref 150–400)
RBC: 3.23 MIL/uL — ABNORMAL LOW (ref 3.87–5.11)
RDW: 22.6 % — ABNORMAL HIGH (ref 11.5–15.5)
WBC: 12.1 10*3/uL — ABNORMAL HIGH (ref 4.0–10.5)
nRBC: 0.3 % — ABNORMAL HIGH (ref 0.0–0.2)

## 2022-01-06 LAB — BASIC METABOLIC PANEL
Anion gap: 5 (ref 5–15)
BUN: 7 mg/dL (ref 6–20)
CO2: 22 mmol/L (ref 22–32)
Calcium: 8.2 mg/dL — ABNORMAL LOW (ref 8.9–10.3)
Chloride: 109 mmol/L (ref 98–111)
Creatinine, Ser: 0.67 mg/dL (ref 0.44–1.00)
GFR, Estimated: >60 mL/min (ref >60)
Glucose, Bld: 104 mg/dL — ABNORMAL HIGH (ref 70–99)
Potassium: 3.9 mmol/L (ref 3.5–5.1)
Sodium: 136 mmol/L (ref 135–145)

## 2022-01-06 MED ORDER — CYCLOBENZAPRINE HCL 5 MG PO TABS
5.0000 mg | ORAL_TABLET | Freq: Three times a day (TID) | ORAL | 0 refills | Status: DC | PRN
Start: 2022-01-06 — End: 2024-07-12

## 2022-01-06 MED ORDER — TAMSULOSIN HCL 0.4 MG PO CAPS: 0.4000 mg | ORAL_CAPSULE | Freq: Every day | ORAL | 0 refills | Status: AC

## 2022-01-06 MED ORDER — POLYETHYLENE GLYCOL 3350 17 G PO PACK: 17.0000 g | PACK | Freq: Every day | ORAL | 0 refills | Status: DC | PRN

## 2022-01-06 MED ORDER — ACETAMINOPHEN 500 MG PO TABS: 1000.0000 mg | ORAL_TABLET | Freq: Four times a day (QID) | ORAL | 0 refills | Status: DC | PRN

## 2022-01-06 MED ORDER — OXYCODONE HCL 5 MG PO TABS: 5.0000 mg | ORAL_TABLET | Freq: Four times a day (QID) | ORAL | 0 refills | Status: DC | PRN
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
## 2022-01-06 NOTE — Progress Notes (Signed)
Mobility Specialist Progress Note: ? ? 01/06/22 1124  ?Mobility  ?Activity Ambulated with assistance in hallway  ?Level of Assistance Standby assist, set-up cues, supervision of patient - no hands on  ?Assistive Device Front wheel walker  ?RLE Weight Bearing WBAT  ?Distance Ambulated (ft) 300 ft  ?Activity Response Tolerated well  ?$Mobility charge 1 Mobility  ? ?Pt received in bed willing to participate in mobility. No complaints of pain. Left in bed with call bell in reach and all needs met.  ? ?Insiya Oshea ?Mobility Specialist ?Primary Phone [phone] ? ?
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
## 2022-01-06 NOTE — Progress Notes (Signed)
Inpatient Rehabilitation Admissions Coordinator  ? ?Noted plans for home health. We will sign off. ? ?Ottie Glazier, RN, MSN ?Rehab Admissions Coordinator ?[phone] ?01/06/2022 11:00 AM ? ?
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
## 2022-01-06 NOTE — TOC Transition Note (Signed)
Transition of Care (TOC) - CM/SW Discharge Note ? ? ?Patient Details  ?Name: Sandra Mejia ?MRN: [id number] ?Date of Birth: 01/31/1976 ? ?Transition of Care [facility]) CM/SW Contact:  ?Glennon Mac, RN ?Phone Number: ?01/06/2022, 10:16 AM ? ? ?Clinical Narrative:    ?Pt medically stable for dc home today with daughter to provide 24h assistance.  PT/OT recommending HH follow up, and patient agreeable to services.  Referral to [facility] for continued therapies.  Referral to [employer] for RW, 3 in 1, and tub bench.  Pt declines need for WC.  Received patient's FMLA paperwork; will complete and fax to patient's work, and send originals to patient in the mail.  She is appreciative of all assistance.  ? ? ?Final next level of care: Home w Home Health Services ?Barriers to Discharge: Barriers Resolved ? ? ?Patient Goals and CMS Choice ?Patient states their goals for this hospitalization and ongoing recovery are:: to go home ?CMS Medicare.gov Compare Post Acute Care list provided to:: Patient ?Choice offered to / list presented to : Patient ? ?           ?  ?  ?  ?  ? ?Discharge Plan and Services ?  ?Discharge Planning Services: CM Consult ?Post Acute Care Choice: Home Health          ?DME Arranged: Walker rolling, 3-N-1, Tub bench ?DME Agency: [facility] ?Date DME Agency Contacted: 01/06/22 ?Time DME Agency Contacted: 1016 ?Representative spoke with at DME Agency: Leavy Cella ?HH Arranged: PT, OT ?HH Agency: [facility] ?Date HH Agency Contacted: 01/06/22 ?Time HH Agency Contacted: 1016 ?Representative spoke with at [facility] Agency: Lorenza Chick ? ?Social Determinants of Health (SDOH) Interventions ?  ? ? ?Readmission Risk Interventions ?   ? View : No data to display.  ?  ?  ?  ? ? ?Quintella Baton, RN, BSN  ?Trauma/Neuro ICU Case Manager ?[phone] ? ? ? ?
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
## 2022-02-22 ENCOUNTER — Other Ambulatory Visit: Payer: Self-pay | Admitting: Student

## 2022-02-22 DIAGNOSIS — S32010A Wedge compression fracture of first lumbar vertebra, initial encounter for closed fracture: Secondary | ICD-10-CM

## 2022-03-18 ENCOUNTER — Ambulatory Visit
Admission: RE | Admit: 2022-03-18 | Discharge: 2022-03-18 | Disposition: A | Discharge status: Home / Self Care | Payer: 59 | Source: Ambulatory Visit | Attending: Student | Admitting: Student

## 2022-03-18 DIAGNOSIS — S32010A Wedge compression fracture of first lumbar vertebra, initial encounter for closed fracture: Secondary | ICD-10-CM

## 2022-04-02 ENCOUNTER — Other Ambulatory Visit: Payer: 59

## 2022-04-09 ENCOUNTER — Other Ambulatory Visit: Payer: 59

## 2022-08-11 ENCOUNTER — Other Ambulatory Visit: Payer: Self-pay | Admitting: Student

## 2022-08-11 DIAGNOSIS — S32010A Wedge compression fracture of first lumbar vertebra, initial encounter for closed fracture: Secondary | ICD-10-CM

## 2022-11-08 IMAGING — US US PELVIS COMPLETE WITH TRANSVAGINAL
1 series · 14 of 25 positions shown · non-contrast
Comparison: Ultrasound 01/27/2014

CLINICAL DATA: Menorrhagia heavy menses

EXAM:
TRANSABDOMINAL AND TRANSVAGINAL ULTRASOUND OF PELVIS
TECHNIQUE: Both transabdominal and transvaginal ultrasound examinations of the
pelvis were performed. Transabdominal technique was performed for
global imaging of the pelvis including uterus, ovaries, adnexal
regions, and pelvic cul-de-sac. It was necessary to proceed with
endovaginal exam following the transabdominal exam to visualize the
uterus endometrium ovaries.

[Series 1: us pelvis complete with transvaginal · 0.38mm/px · 14 of 46 slices shown]
[im 1/46]
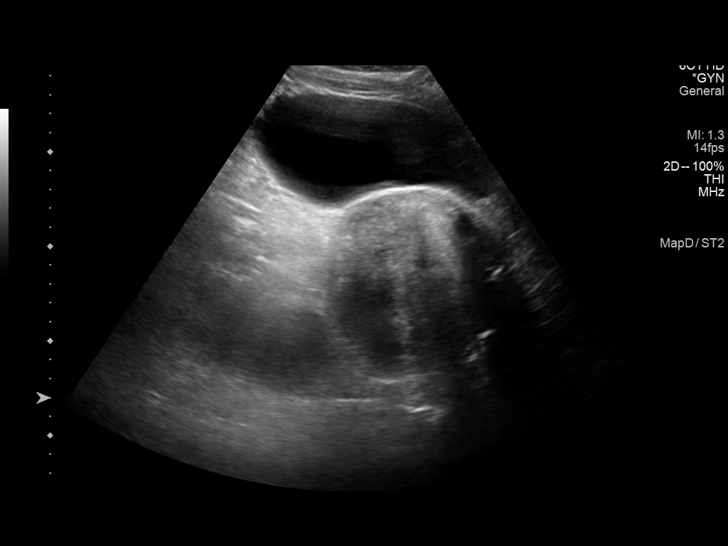
[im 4/46]
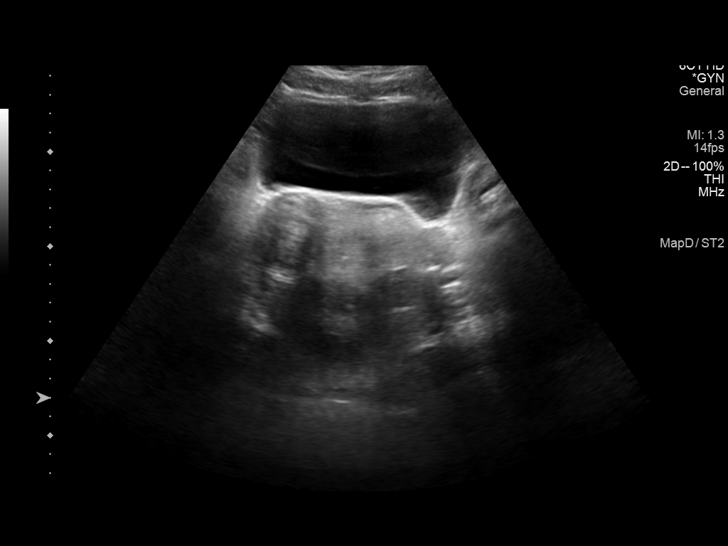
[im 8/46]
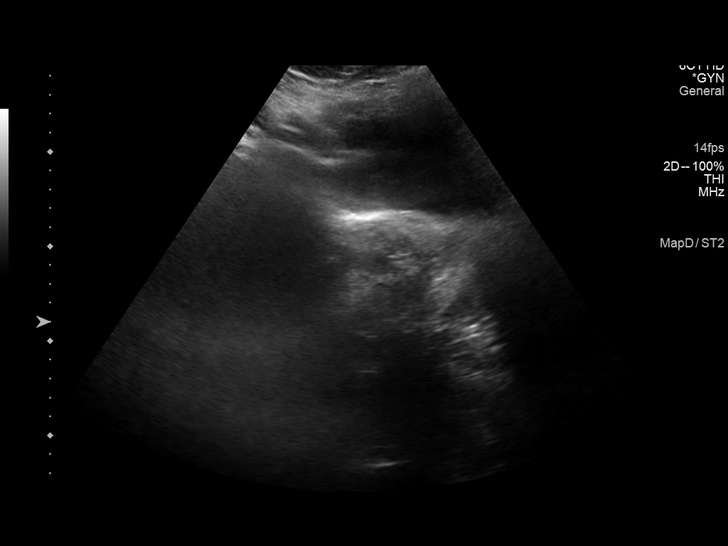
[im 12/46]
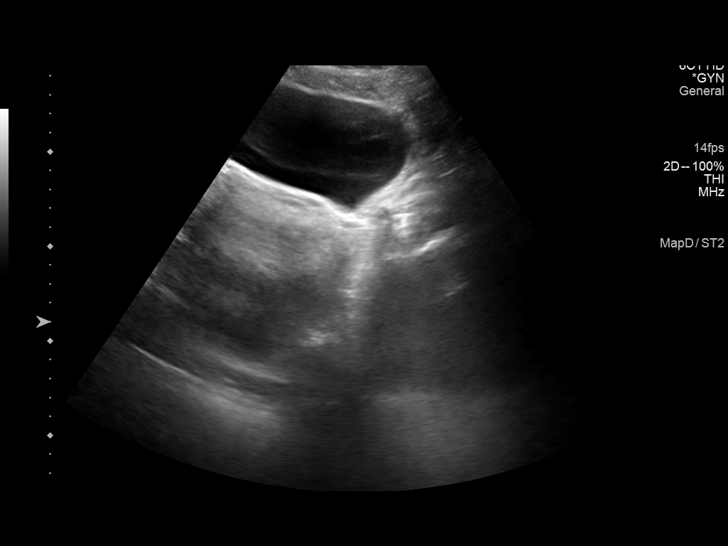
[im 16/46]
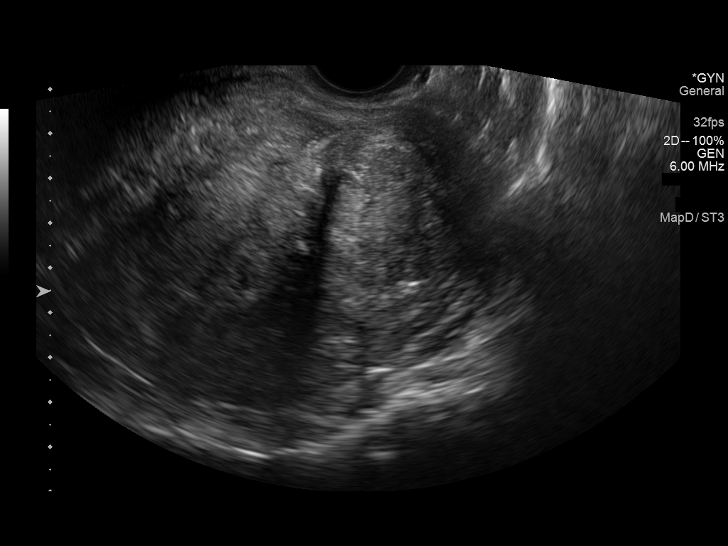
[im 17/46]
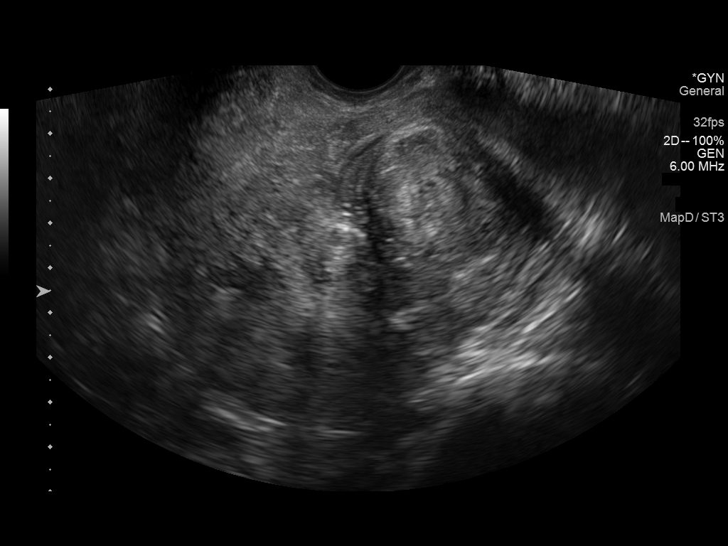
[im 21/46]
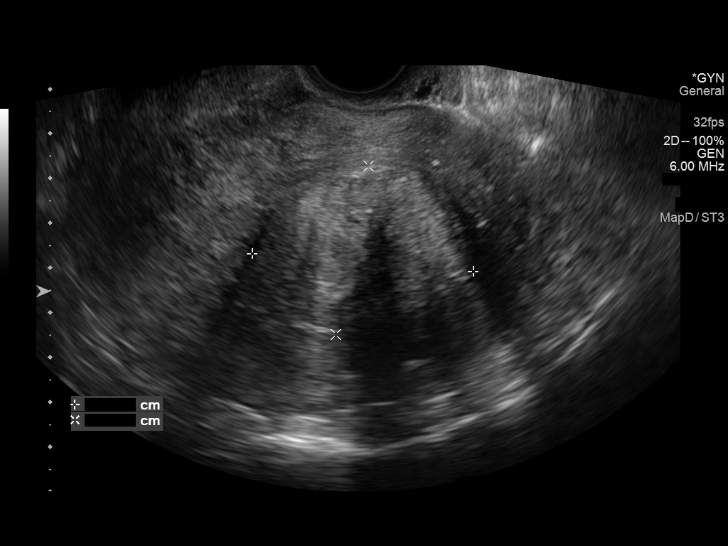
[im 25/46]
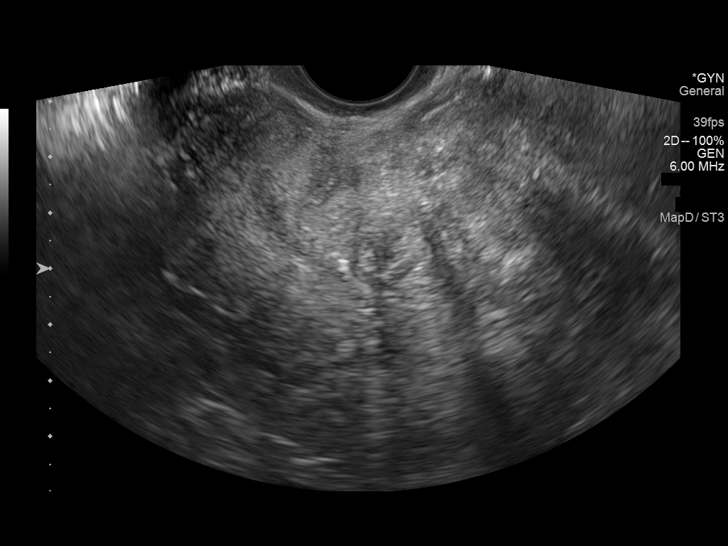
[im 29/46]
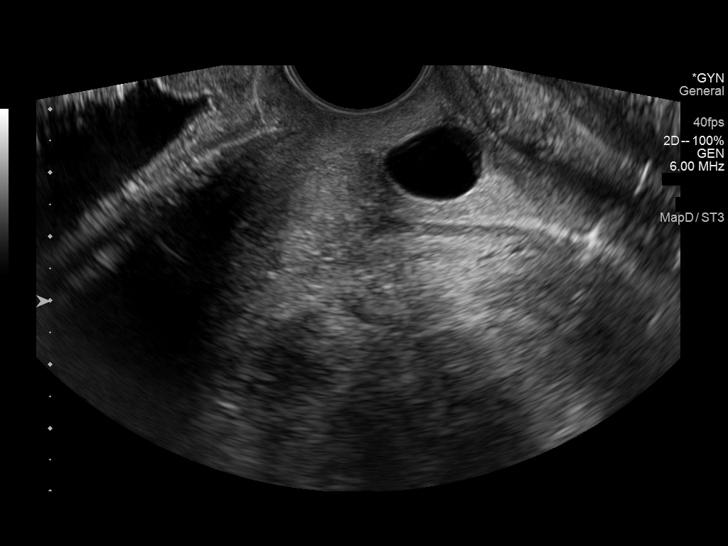
[im 31/46]
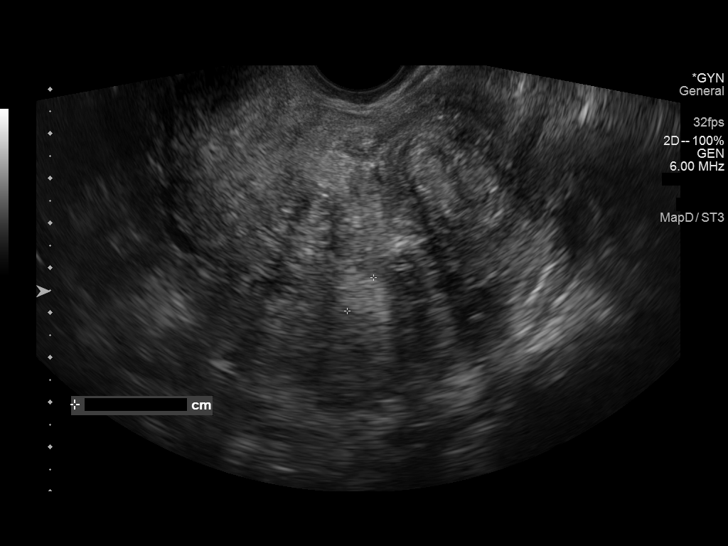
[im 34/46]
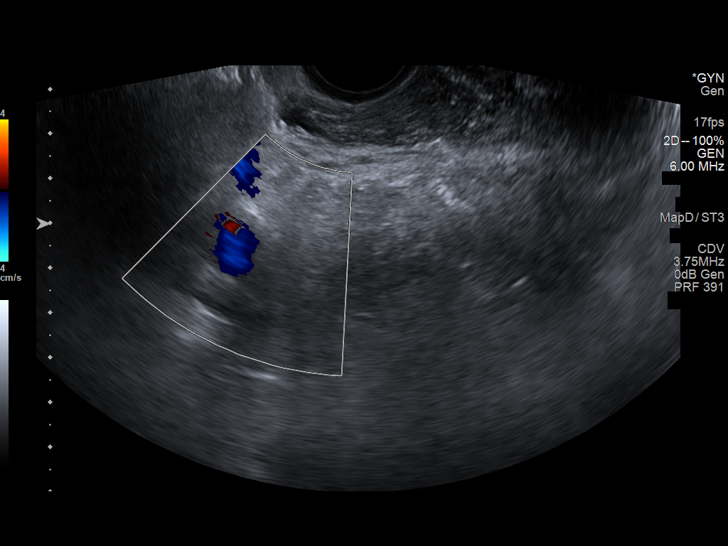
[im 38/46]
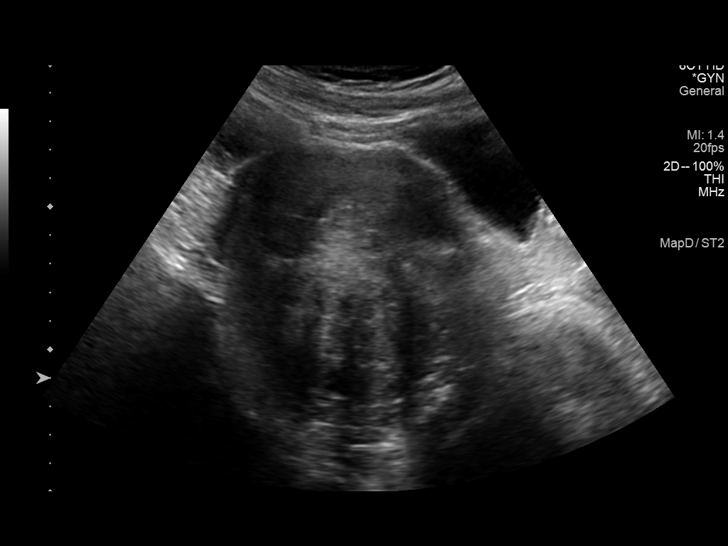
[im 42/46]
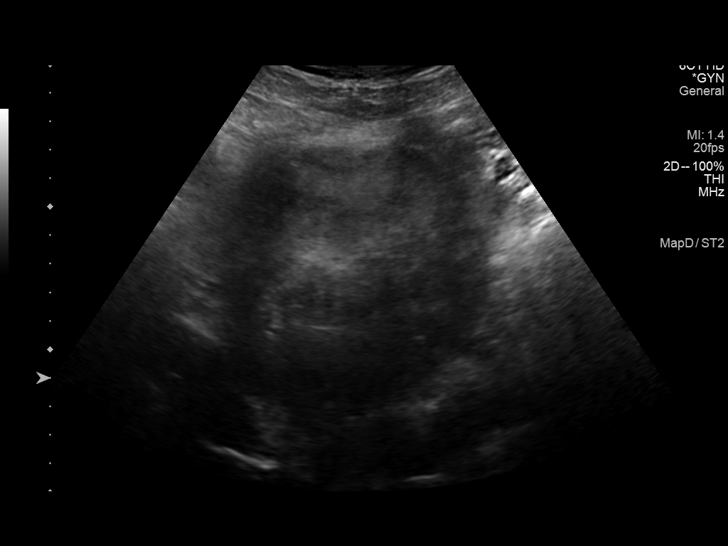
[im 46/46]
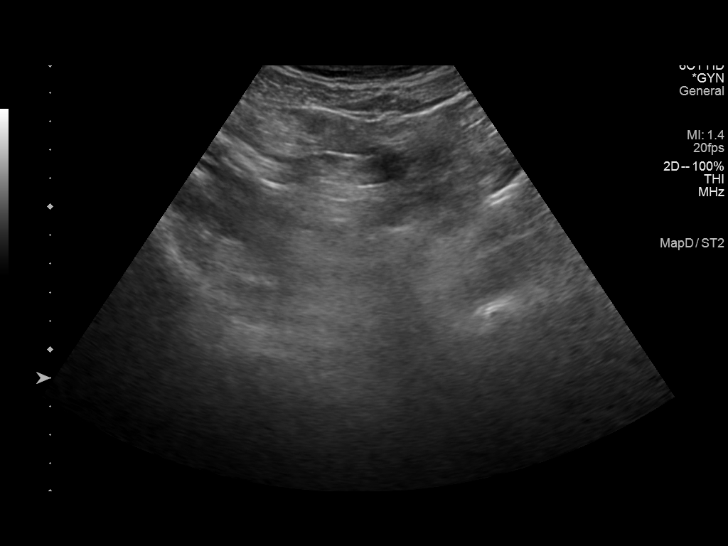

[14 of 25 positions shown; findings below may reference images not displayed]

FINDINGS: Uterus

Measurements: 11.5 x 10.1 x 10.9 cm = volume: 657.3 mL. Multiple
uterine masses. Submucosal left posterior fibroid measuring 4 x
x 3.1 cm. Submucosal fundal fibroid measuring 4.5 x 5 x 3.8 cm.
Nabothian cyst in the cervix.

Endometrium

Thickness: 9.5 mm.  No focal abnormality visualized.

Right ovary

Not seen

Left ovary

Not seen

Other findings

No abnormal free fluid.
IMPRESSION: 1. Enlarged fibroid uterus.
2. Normal premenopausal endometrial thickness.
3. Nonvisualized ovaries

## 2023-02-02 ENCOUNTER — Other Ambulatory Visit: Payer: Self-pay | Admitting: Obstetrics and Gynecology

## 2023-02-02 DIAGNOSIS — N6320 Unspecified lump in the left breast, unspecified quadrant: Secondary | ICD-10-CM

## 2023-04-14 ENCOUNTER — Ambulatory Visit
Admission: RE | Admit: 2023-04-14 | Discharge: 2023-04-14 | Disposition: A | Discharge status: Home / Self Care | Payer: 59 | Source: Ambulatory Visit | Attending: Obstetrics and Gynecology | Admitting: Obstetrics and Gynecology

## 2023-04-14 ENCOUNTER — Other Ambulatory Visit: Payer: Self-pay | Admitting: Obstetrics and Gynecology

## 2023-04-14 DIAGNOSIS — N6320 Unspecified lump in the left breast, unspecified quadrant: Secondary | ICD-10-CM

## 2023-04-14 DIAGNOSIS — R921 Mammographic calcification found on diagnostic imaging of breast: Secondary | ICD-10-CM

## 2023-04-22 ENCOUNTER — Ambulatory Visit
Admission: RE | Admit: 2023-04-22 | Discharge: 2023-04-22 | Disposition: A | Discharge status: Home / Self Care | Payer: 59 | Source: Ambulatory Visit | Attending: Obstetrics and Gynecology | Admitting: Obstetrics and Gynecology

## 2023-04-22 DIAGNOSIS — R921 Mammographic calcification found on diagnostic imaging of breast: Secondary | ICD-10-CM

## 2023-04-22 HISTORY — PX: BREAST BIOPSY: SHX20
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
## 2023-11-21 IMAGING — US US BREAST*L* LIMITED INC AXILLA
1 series · 9 of 9 positions shown · non-contrast
Comparison: Previous exam(s).

CLINICAL DATA: Six-month follow-up of 2 adjacent left breast
masses.

EXAM:
ULTRASOUND OF THE LEFT BREAST

[Series 1: us breast*left* limited inc axilla · 0.05mm/px · 9 of 9 slices shown]
[im 1/9]
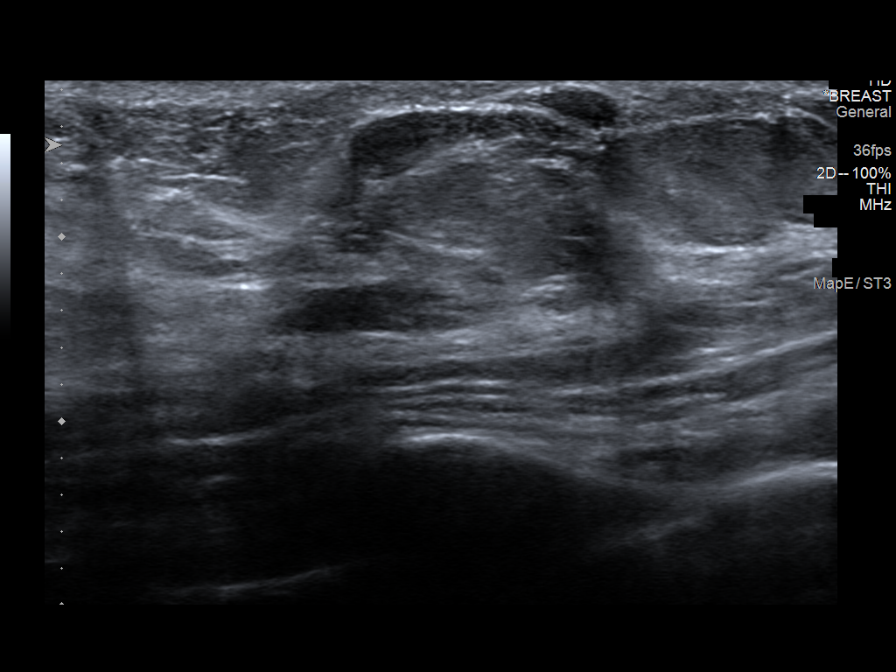
[im 2/9]
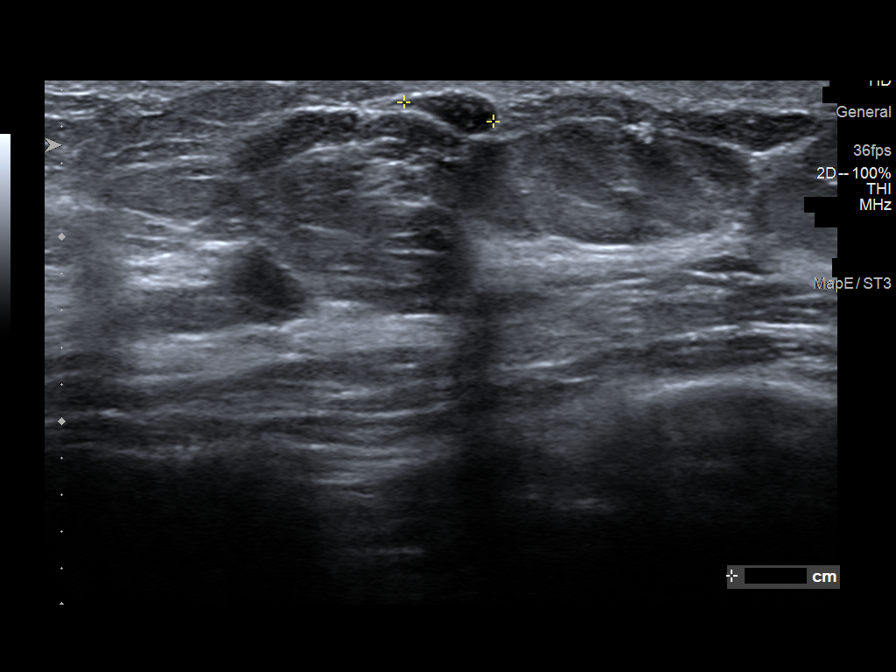
[im 3/9]
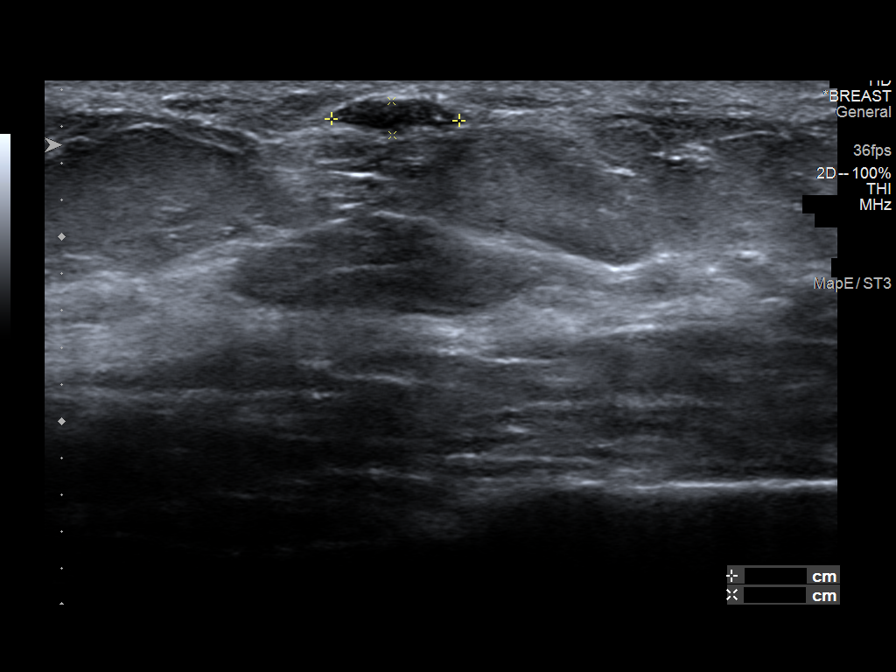
[im 4/9]
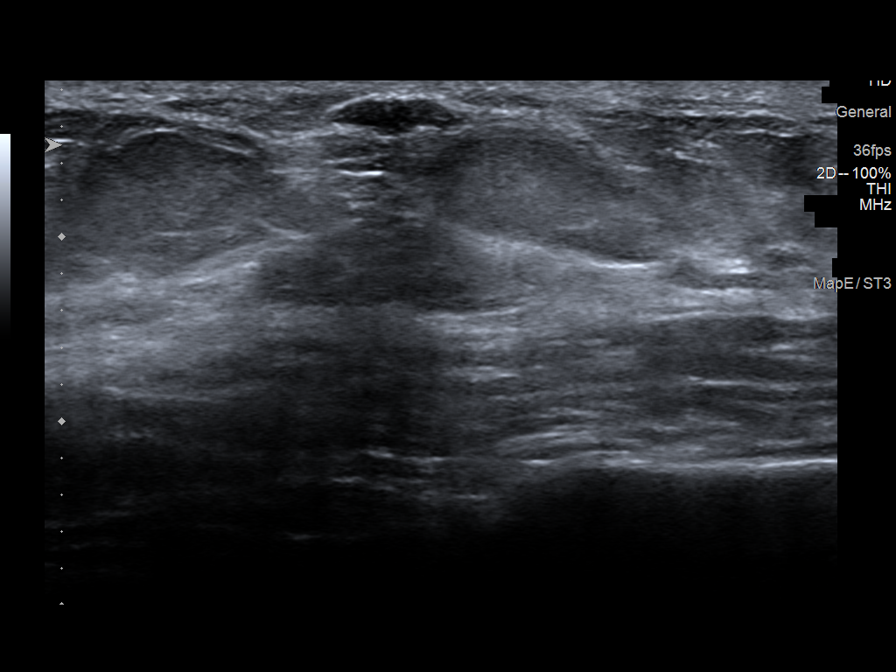
[im 5/9]
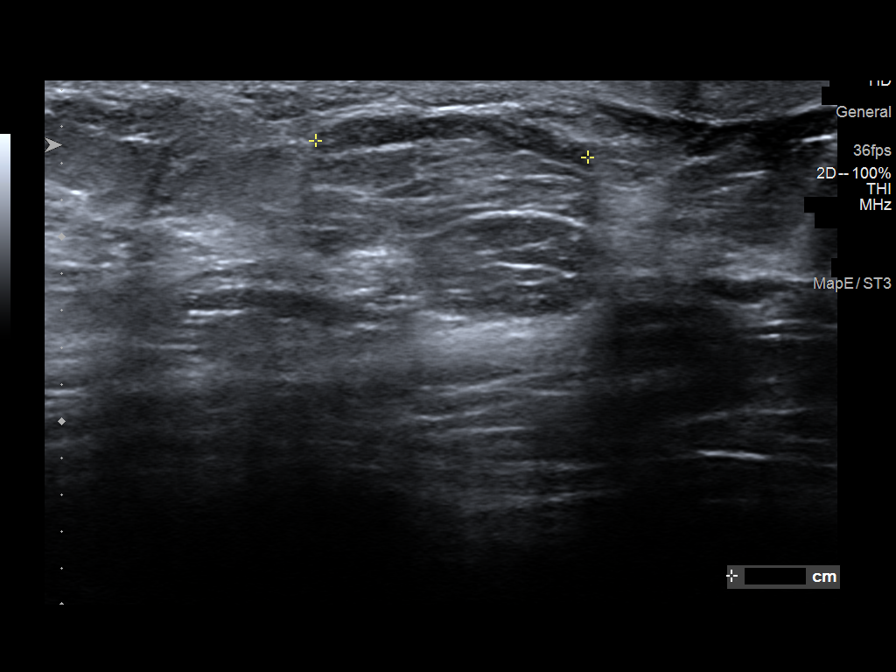
[im 6/9]
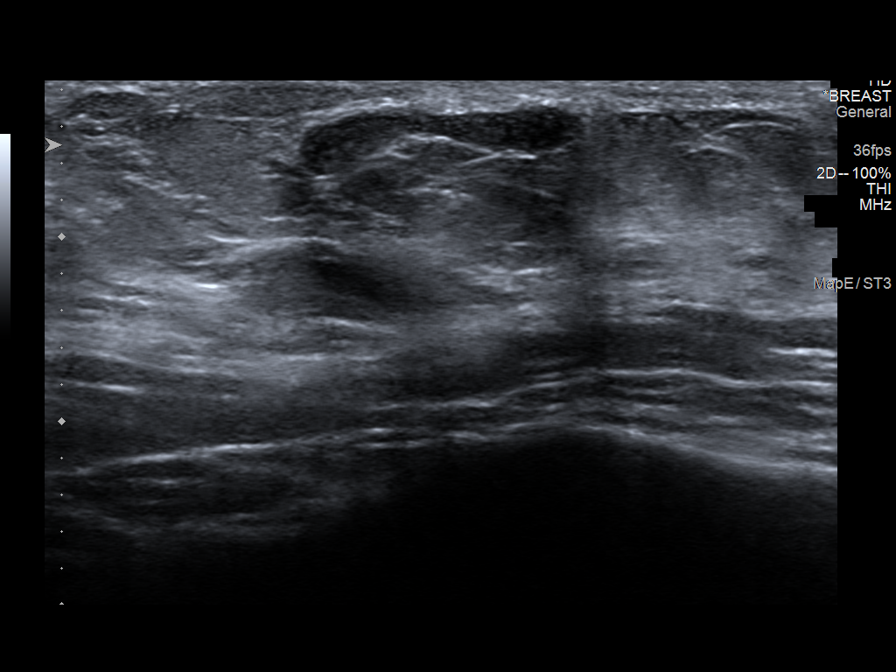
[im 7/9]
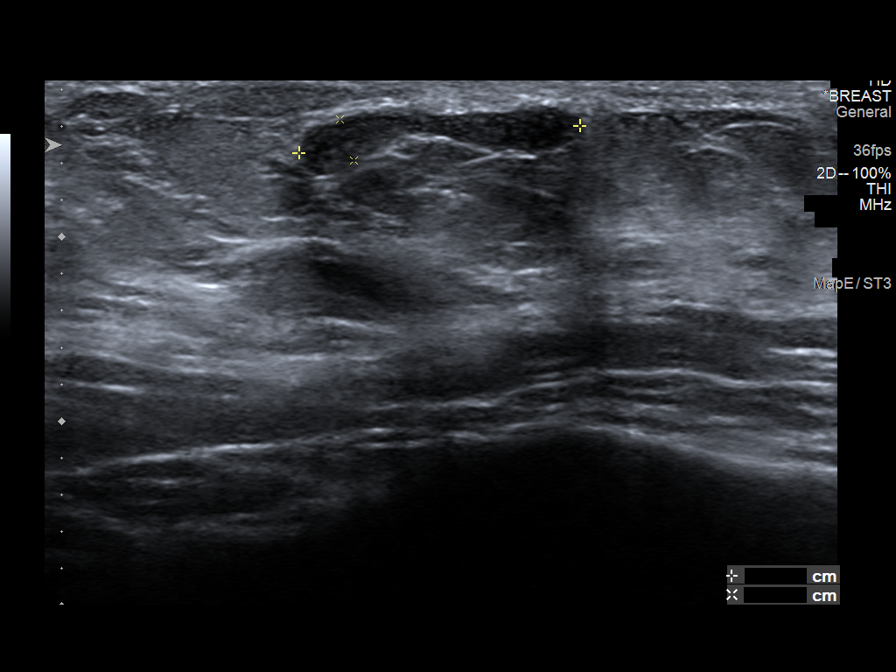
[im 8/9]
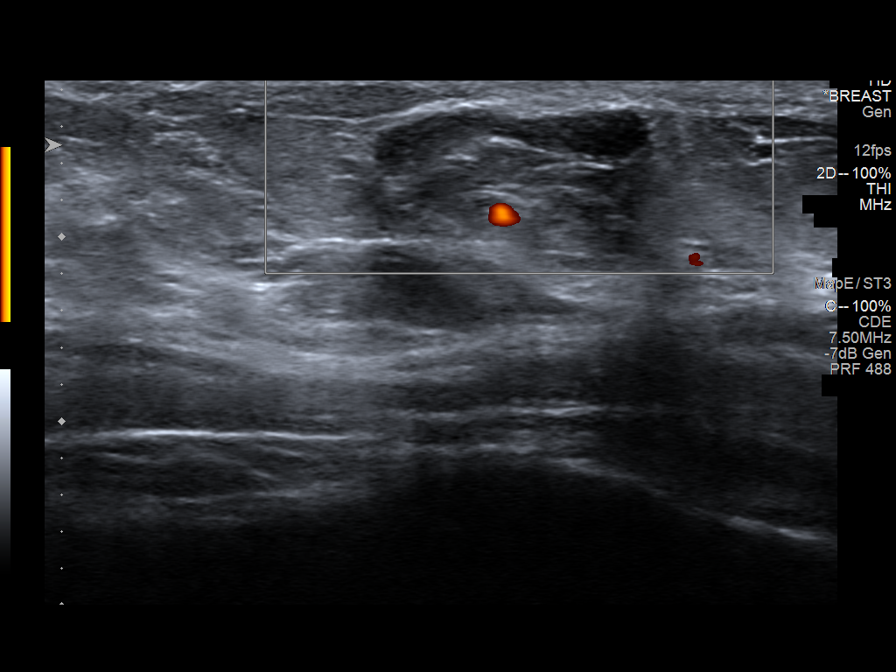
[im 9/9]
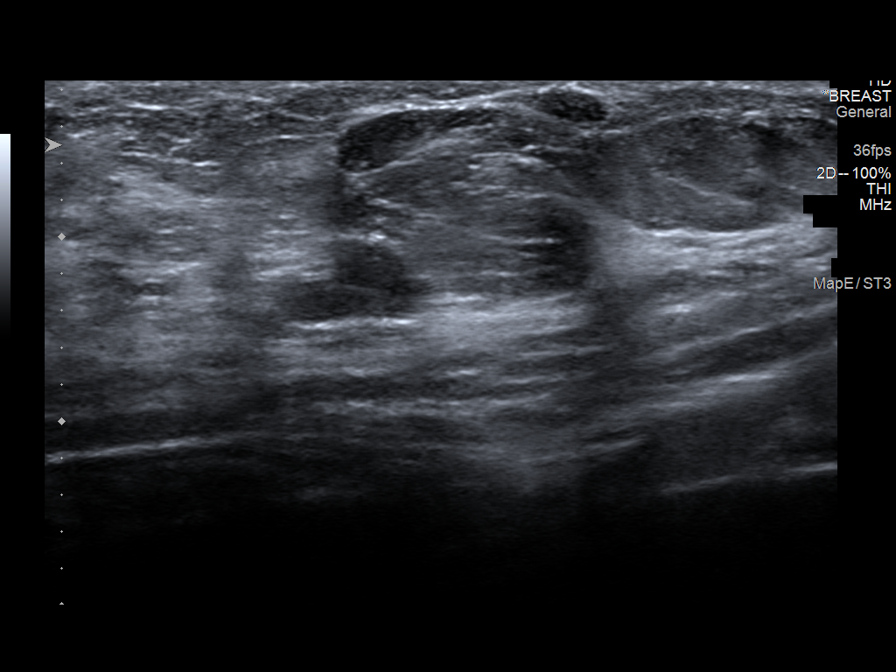

[9 of 9 positions shown; findings below may reference images not displayed]

FINDINGS: Targeted ultrasound is performed, showing 2 adjacent left breast
masses. The smaller mass measures 5 by 7 x 2 mm today versus 4 by 7
x 2 mm previously, unchanged.

The larger mass measures 1.5 by 1.5 x 0.2 cm today versus 1.2 x
x 0.2 cm previously. The difference in measurement is likely
technical.
IMPRESSION: Two adjacent probably benign left breast masses without definitive
change. Small differences in measurement are likely technical.

RECOMMENDATION:
Recommend six-month follow-up ultrasound of the probably benign left
breast masses. The patient will be due for bilateral mammography at
that time.

I have discussed the findings and recommendations with the patient.
If applicable, a reminder letter will be sent to the patient
regarding the next appointment.

BI-RADS CATEGORY  3: Probably benign.
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
## 2024-02-18 IMAGING — DX DG ABD PORTABLE 1V
1 series · 2 of 2 positions shown · non-contrast
Comparison: 12/30/2021 and MR abdomen 12/28/2021.

CLINICAL DATA: Nausea and vomiting.

EXAM:
PORTABLE ABDOMEN - 1 VIEW

[Series 1: abdomen · 0.14mm/px · 2 of 2 slices shown]
[im 1/2]
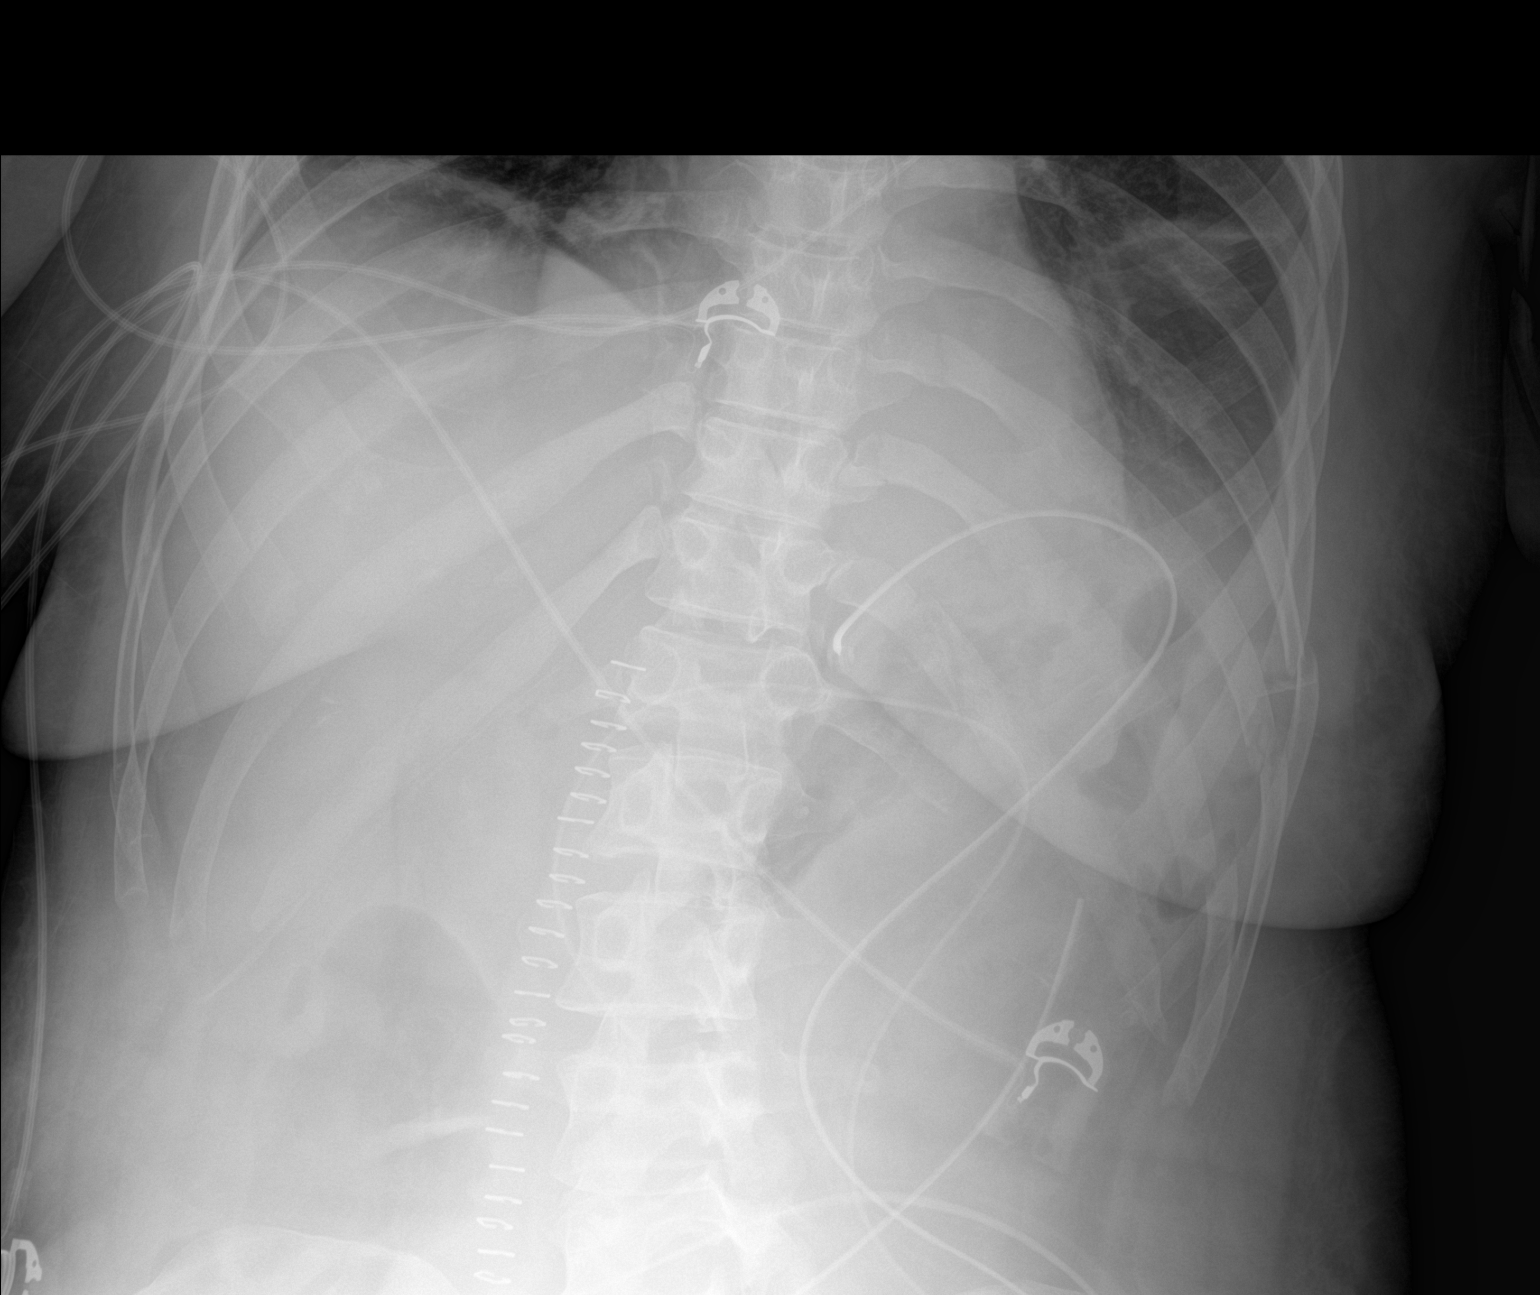
[im 2/2]
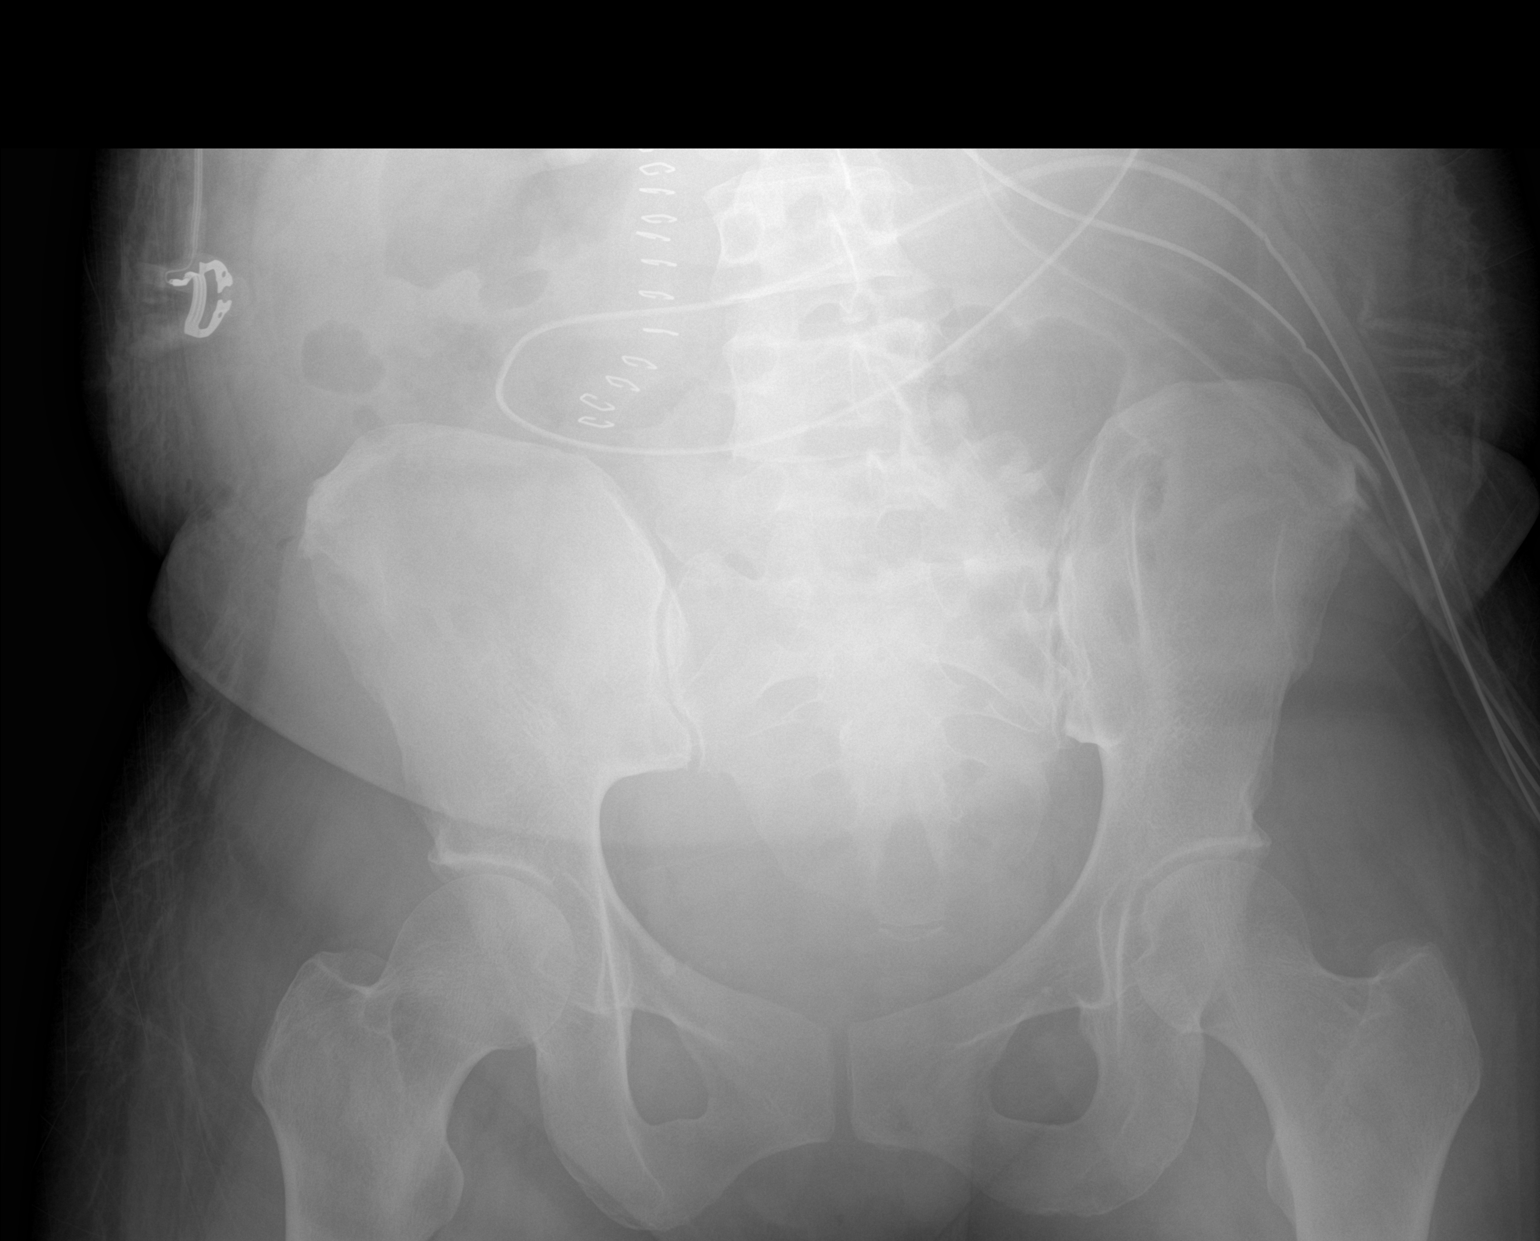

[2 of 2 positions shown; findings below may reference images not displayed]

FINDINGS: Surgical drains are seen in the mid and left abdomen. Surgical skin
staples in the abdominal midline. Gaseous distension of the cecum.
Relative paucity of gas elsewhere. Bibasilar atelectasis. Small left
pleural effusion. Lower left rib fractures.
IMPRESSION: 1. Persistent gaseous distension of cecum, overall improved colonic
distension from 12/30/2021.
2. Surgical drains remain in the left abdomen.
3. Bibasilar atelectasis, left lower lobe consolidation and left
pleural effusion.
4. Left rib fractures.

## 2024-02-21 ENCOUNTER — Other Ambulatory Visit: Payer: Self-pay | Admitting: Obstetrics and Gynecology

## 2024-02-21 ENCOUNTER — Encounter: Payer: Self-pay | Admitting: Obstetrics and Gynecology

## 2024-02-21 DIAGNOSIS — R921 Mammographic calcification found on diagnostic imaging of breast: Secondary | ICD-10-CM

## 2024-02-28 ENCOUNTER — Ambulatory Visit
Admission: RE | Admit: 2024-02-28 | Discharge: 2024-02-28 | Disposition: A | Discharge status: Home / Self Care | Source: Ambulatory Visit | Attending: Obstetrics and Gynecology

## 2024-02-28 DIAGNOSIS — R921 Mammographic calcification found on diagnostic imaging of breast: Secondary | ICD-10-CM

## 2024-02-29 ENCOUNTER — Other Ambulatory Visit: Payer: Self-pay | Admitting: Obstetrics and Gynecology

## 2024-02-29 DIAGNOSIS — R921 Mammographic calcification found on diagnostic imaging of breast: Secondary | ICD-10-CM

## 2024-04-18 ENCOUNTER — Ambulatory Visit
Admission: RE | Admit: 2024-04-18 | Discharge: 2024-04-18 | Disposition: A | Discharge status: Home / Self Care | Source: Ambulatory Visit | Attending: Obstetrics and Gynecology | Admitting: Obstetrics and Gynecology

## 2024-04-18 DIAGNOSIS — R921 Mammographic calcification found on diagnostic imaging of breast: Secondary | ICD-10-CM

## 2024-07-12 ENCOUNTER — Ambulatory Visit
Admission: EM | Admit: 2024-07-12 | Discharge: 2024-07-12 | Disposition: A | Discharge status: Home / Self Care | Payer: Self-pay | Attending: Family Medicine | Admitting: Family Medicine

## 2024-07-12 ENCOUNTER — Ambulatory Visit (INDEPENDENT_AMBULATORY_CARE_PROVIDER_SITE_OTHER): Payer: Worker's Compensation

## 2024-07-12 DIAGNOSIS — W109XXA Fall (on) (from) unspecified stairs and steps, initial encounter: Secondary | ICD-10-CM

## 2024-07-12 DIAGNOSIS — S8002XA Contusion of left knee, initial encounter: Secondary | ICD-10-CM

## 2024-07-12 DIAGNOSIS — M25562 Pain in left knee: Secondary | ICD-10-CM | POA: Diagnosis not present

## 2024-07-12 NOTE — ED Triage Notes (Signed)
 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 Now having swelling and pain in her left knee with a small abrasion.   Pt states this will be workers comp, but does not have the paperwork yet.

## 2024-07-12 NOTE — Discharge Instructions (Addendum)
 Put ice on knee when you get home May take ibuprofen 800 mg up to 3 times a day If you are unable to complete your work duties tomorrow, you will need to report to the [facility]. injury provider. Wash the wound once a day, put antibiotic ointment and Band-Aid.  Watch for infection
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
